# Patient Record
Sex: Female | Born: 2000 | Race: White | Hispanic: No | Marital: Single | Smoking: Former smoker
Health system: Southern US, Community
[De-identification: ages and names within clinical notes are randomized; demographics above are authoritative.]

## PROBLEM LIST (undated history)

## (undated) DIAGNOSIS — R011 Cardiac murmur, unspecified: Secondary | ICD-10-CM

## (undated) DIAGNOSIS — Z5189 Encounter for other specified aftercare: Secondary | ICD-10-CM

## (undated) DIAGNOSIS — N83209 Unspecified ovarian cyst, unspecified side: Secondary | ICD-10-CM

## (undated) HISTORY — DX: Encounter for other specified aftercare: Z51.89

## (undated) HISTORY — PX: CYST REMOVAL NECK: SHX6281

## (undated) HISTORY — DX: Unspecified ovarian cyst, unspecified side: N83.209

---
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
## 2018-04-13 ENCOUNTER — Encounter: Payer: Self-pay | Admitting: Obstetrics and Gynecology

## 2018-04-17 ENCOUNTER — Encounter: Payer: Managed Care, Other (non HMO) | Admitting: Obstetrics and Gynecology

## 2018-04-24 ENCOUNTER — Ambulatory Visit (INDEPENDENT_AMBULATORY_CARE_PROVIDER_SITE_OTHER): Payer: Managed Care, Other (non HMO) | Admitting: Obstetrics & Gynecology

## 2018-04-24 ENCOUNTER — Encounter: Payer: Self-pay | Admitting: Obstetrics & Gynecology

## 2018-04-24 ENCOUNTER — Other Ambulatory Visit: Payer: Self-pay | Admitting: Obstetrics & Gynecology

## 2018-04-24 VITALS — BP 90/60 | Ht 59.0 in | Wt 111.0 lb

## 2018-04-24 DIAGNOSIS — Z0000 Encounter for general adult medical examination without abnormal findings: Secondary | ICD-10-CM | POA: Diagnosis not present

## 2018-04-24 DIAGNOSIS — Z113 Encounter for screening for infections with a predominantly sexual mode of transmission: Secondary | ICD-10-CM

## 2018-04-24 NOTE — Progress Notes (Signed)
HPI:      [name] is a 17 y.o. G0P0000 who LMP was Patient's last menstrual period was 04/21/2018., she presents today for her annual examination. The patient has no complaints today. The patient is not sexually active. Her no prior history of gyn screening tests. The patient does not perform self breast exams.  There is no notable family history of breast or ovarian cancer in her family.  The patient has regular exercise: yes.  The patient denies current symptoms of depression.    GYN History: Contraception: abstinence  PMHx: History reviewed. No pertinent past medical history. History reviewed. No pertinent surgical history. Family History  Problem Relation Age of Onset  . Hypertension Mother    Social History   Tobacco Use  . Smoking status: Never Smoker  . Smokeless tobacco: Never Used  Substance Use Topics  . Alcohol use: No  . Drug use: Never   No current outpatient medications on file. Allergies: Patient has no known allergies.  Review of Systems  Constitutional: Negative for chills, fever and malaise/fatigue.  HENT: Negative for congestion, sinus pain and sore throat.   Eyes: Negative for blurred vision and pain.  Respiratory: Positive for cough. Negative for wheezing.   Cardiovascular: Negative for chest pain and leg swelling.  Gastrointestinal: Positive for diarrhea, nausea and vomiting. Negative for abdominal pain, constipation and heartburn.  Genitourinary: Negative for dysuria, frequency, hematuria and urgency.  Musculoskeletal: Positive for joint pain. Negative for back pain, myalgias and neck pain.  Skin: Negative for itching and rash.  Neurological: Negative for dizziness, tremors and weakness.  Endo/Heme/Allergies: Does not bruise/bleed easily.  Psychiatric/Behavioral: Negative for depression. The patient is not nervous/anxious and does not have insomnia.    Objective: BP 90/60   Ht  (1.499 m)   Wt 111 lb (50.3 kg)   LMP 04/21/2018    BMI 22.42 kg/m   Filed Weights   04/24/18 0816  Weight: 111 lb (50.3 kg)   Body mass index is 22.42 kg/m. Physical Exam  Constitutional: She is oriented to person, place, and time. She appears well-developed and well-nourished. No distress.  HENT:  Head: Normocephalic and atraumatic. Head is without laceration.  Right Ear: Hearing normal.  Left Ear: Hearing normal.  Nose: No epistaxis.  No foreign bodies.  Mouth/Throat: Uvula is midline, oropharynx is clear and moist and mucous membranes are normal.  Eyes: Pupils are equal, round, and reactive to light.  Neck: Normal range of motion. Neck supple. No thyromegaly present.  Cardiovascular: Normal rate and regular rhythm. Exam reveals no gallop and no friction rub.  No murmur heard. Pulmonary/Chest: Effort normal and breath sounds normal. No respiratory distress. She has no wheezes. Right breast exhibits no mass, no skin change and no tenderness. Left breast exhibits no mass, no skin change and no tenderness.  Abdominal: Soft. Bowel sounds are normal. She exhibits no distension. There is no tenderness. There is no rebound.  Musculoskeletal: Normal range of motion.  Neurological: She is alert and oriented to person, place, and time. No cranial nerve deficit.  Skin: Skin is warm and dry.  Psychiatric: She has a normal mood and affect. Judgment normal.  Vitals reviewed. GYN deferred  Assessment:  ANNUAL EXAM 1. Annual physical exam   2. Screen for STD (sexually transmitted disease)    Screening Plan:            1.  Cervical Screening-  Pap smear to be scheduled, age 26  2.  Counseling for contraception: no method desired; info provided as to options for birth control or for period control   3. Screen for STD (sexually transmitted disease) (routine) - GC probe amplification, urine    F/U  Return in about 1 year (around 04/25/2019) for Annual.  Annamarie Major, MD, [facility], [facility] 04/24/2018  8:46  AM

## 2018-04-24 NOTE — Patient Instructions (Signed)
Plan PAP age 17 Screen exam yearly Let [place] know if need birth control or other questions

## 2018-04-25 LAB — CHLAMYDIA/GONOCOCCUS/TRICHOMONAS, NAA
CHLAMYDIA BY NAA: NEGATIVE
Gonococcus by NAA: NEGATIVE
TRICH VAG BY NAA: NEGATIVE

## 2018-04-28 ENCOUNTER — Telehealth: Payer: Self-pay

## 2018-04-28 NOTE — Telephone Encounter (Signed)
Pt calling triage today requesting that [facility] send in OCP's. States they had talked about this at her appt this week, has not ever been on birth control so not sure what kind she would like. Please advise/RX and I can let pt know. She is aware you are off today.

## 2018-05-02 ENCOUNTER — Other Ambulatory Visit: Payer: Self-pay | Admitting: Obstetrics & Gynecology

## 2018-05-02 MED ORDER — NORETHIN ACE-ETH ESTRAD-FE 1-20 MG-MCG PO TABS: 1.0000 | ORAL_TABLET | Freq: Every day | ORAL | 11 refills | Status: DC

## 2018-05-02 NOTE — Progress Notes (Signed)
Testing neg for STD

## 2018-05-02 NOTE — Telephone Encounter (Signed)
ERx done for Junel OCP, to start after her next period (first Sunday after period starts)

## 2018-05-02 NOTE — Telephone Encounter (Signed)
Pt aware.

## 2018-05-09 ENCOUNTER — Encounter: Payer: Managed Care, Other (non HMO) | Admitting: Obstetrics and Gynecology

## 2018-05-12 ENCOUNTER — Emergency Department
Admission: EM | Admit: 2018-05-12 | Discharge: 2018-05-12 | Disposition: A | Discharge status: Home / Self Care | Payer: Managed Care, Other (non HMO) | Attending: Emergency Medicine | Admitting: Emergency Medicine

## 2018-05-12 ENCOUNTER — Emergency Department: Payer: Managed Care, Other (non HMO)

## 2018-05-12 ENCOUNTER — Other Ambulatory Visit: Payer: Self-pay

## 2018-05-12 DIAGNOSIS — N83201 Unspecified ovarian cyst, right side: Secondary | ICD-10-CM | POA: Insufficient documentation

## 2018-05-12 DIAGNOSIS — N939 Abnormal uterine and vaginal bleeding, unspecified: Secondary | ICD-10-CM | POA: Insufficient documentation

## 2018-05-12 LAB — URINALYSIS, COMPLETE (UACMP) WITH MICROSCOPIC
BILIRUBIN URINE: NEGATIVE
GLUCOSE, UA: NEGATIVE mg/dL
KETONES UR: 5 mg/dL — AB
LEUKOCYTES UA: NEGATIVE
NITRITE: NEGATIVE
PH: 5 (ref 5.0–8.0)
Protein, ur: NEGATIVE mg/dL
Specific Gravity, Urine: 1.016 (ref 1.005–1.030)

## 2018-05-12 LAB — BASIC METABOLIC PANEL
Anion gap: 6 (ref 5–15)
BUN: 20 mg/dL (ref 6–20)
CO2: 24 mmol/L (ref 22–32)
CREATININE: 0.68 mg/dL (ref 0.44–1.00)
Calcium: 9.1 mg/dL (ref 8.9–10.3)
Chloride: 108 mmol/L (ref 101–111)
GFR calc non Af Amer: >60 mL/min (ref >60)
Glucose, Bld: 138 mg/dL — ABNORMAL HIGH (ref 65–99)
POTASSIUM: 3.9 mmol/L (ref 3.5–5.1)
Sodium: 138 mmol/L (ref 135–145)

## 2018-05-12 LAB — TYPE AND SCREEN
ABO/RH(D): O POS
Antibody Screen: NEGATIVE

## 2018-05-12 LAB — WET PREP, GENITAL
CLUE CELLS WET PREP: NONE SEEN
Sperm: NONE SEEN
TRICH WET PREP: NONE SEEN
Yeast Wet Prep HPF POC: NONE SEEN

## 2018-05-12 LAB — CHLAMYDIA/NGC RT PCR (ARMC ONLY)
CHLAMYDIA TR: NOT DETECTED
N GONORRHOEAE: NOT DETECTED

## 2018-05-12 LAB — CBC
HCT: 35.8 % (ref 35.0–47.0)
Hemoglobin: 12 g/dL (ref 12.0–16.0)
MCH: 27.4 pg (ref 26.0–34.0)
MCHC: 33.5 g/dL (ref 32.0–36.0)
MCV: 81.7 fL (ref 80.0–100.0)
PLATELETS: 334 10*3/uL (ref 150–440)
RBC: 4.38 MIL/uL (ref 3.80–5.20)
RDW: 13.4 % (ref 11.5–14.5)
WBC: 20.2 10*3/uL — AB (ref 3.6–11.0)

## 2018-05-12 LAB — POC URINE PREG, ED: Preg Test, Ur: NEGATIVE

## 2018-05-12 MED ORDER — LEVONORGESTREL 1.5 MG PO TABS: 1.5000 mg | ORAL_TABLET | Freq: Once | ORAL | 0 refills | Status: AC

## 2018-05-12 NOTE — ED Provider Notes (Signed)
[facility] Emergency Department Provider Note  ____________________________________________  Time seen: Approximately 5:23 PM  I have reviewed the triage vital signs and the nursing notes.   HISTORY  Chief Complaint Vaginal Bleeding and Near Syncope    HPI Riot J [place] is a 17 y.o. female with no past medical history who complains of brisk vaginal bleeding after having intercourse for the first time today. She reports that bleeding began immediately upon penetration and so she and her partner stopped having sex. She also complains of pelvic pain worse on the right. pain is nonradiating, no fevers or chills no aggravating or alleviating factors. Pain is moderate intensity, achy.    past medical history negative   There are no active problems to display for this patient.    past surgical history negative   Prior to Admission medications   Medication Sig Start Date End Date Taking? Authorizing Provider  norethindrone-ethinyl estradiol (JUNEL FE,GILDESS FE,LOESTRIN FE) 1-20 MG-MCG tablet Take 1 tablet by mouth daily. 05/02/18 05/02/19  Nadara Mustard, MD     Allergies Patient has no known allergies.   Family History  Problem Relation Age of Onset  . Hypertension Mother     Social History Social History   Tobacco Use  . Smoking status: Never Smoker  . Smokeless tobacco: Never Used  Substance Use Topics  . Alcohol use: No  . Drug use: Never    Review of Systems  Constitutional:   No fever or chills.  ENT:   No sore throat. No rhinorrhea. Cardiovascular:   No chest pain or syncope. Respiratory:   No dyspnea or cough. Gastrointestinal:   positive for pelvic pain as above without vomiting and diarrhea.  Musculoskeletal:   Negative for focal pain or swelling All other systems reviewed and are negative except as documented above in ROS and HPI.  ____________________________________________   PHYSICAL EXAM:  VITAL SIGNS: ED Triage  Vitals  Enc Vitals Group     BP 05/12/18 1231 (!) 101/56     Pulse Rate 05/12/18 1231 87     Resp 05/12/18 1231 18     Temp 05/12/18 1231 (!) 97.5 F (36.4 C)     Temp Source 05/12/18 1231 Oral     SpO2 05/12/18 1231 99 %     Weight 05/12/18 1232 111 lb (50.3 kg)     Height 05/12/18 1232 4\' 11"  (1.499 m)     Head Circumference --      Peak Flow --      Pain Score 05/12/18 1232 4     Pain Loc --      Pain Edu? --      Excl. in GC? --     Vital signs reviewed, nursing assessments reviewed.   Constitutional:   Alert and oriented.nontoxic-appearing. Eyes:   Conjunctivae are normal. EOMI. PERRL. ENT      Head:   Normocephalic and atraumatic.      Nose:   No congestion/rhinnorhea.       Mouth/Throat:   MMM, no pharyngeal erythema. No peritonsillar mass.       Neck:   No meningismus. Full ROM. Hematological/Lymphatic/Immunilogical:   No cervical lymphadenopathy. Cardiovascular:   RRR. Symmetric bilateral radial and DP pulses.  No murmurs.  Respiratory:   Normal respiratory effort without tachypnea/retractions. Breath sounds are clear and equal bilaterally. No wheezes/rales/rhonchi. Gastrointestinal:   Soft suprapubic tenderness, worse slight to the right and no tenderness at McBurney's point. Non distended. There is no CVA tenderness.  No rebound, rigidity, or guarding. Genitourinary:   External exam unremarkable. Spec. Exam reveals copious clotted blood in the vault. No active bleeding after clearing clots with Fox swabs and ring forceps. Bimanual exam reveals right adnexal tenderness without palpable mass. No CMT. No purulence drainage. Musculoskeletal:   Normal range of motion in all extremities. No joint effusions.  No lower extremity tenderness.  No edema. Neurologic:   Normal speech and language.  Motor grossly intact. No acute focal neurologic deficits are appreciated.  Skin:    Skin is warm, dry and intact. No rash noted.  No petechiae, purpura, or  bullae.  ____________________________________________    LABS (pertinent positives/negatives) (all labs ordered are listed, but only abnormal results are displayed) Labs Reviewed  WET PREP, GENITAL - Abnormal; Notable for the following components:      Result Value   WBC, Wet Prep HPF POC RARE (*)    All other components within normal limits  BASIC METABOLIC PANEL - Abnormal; Notable for the following components:   Glucose, Bld 138 (*)    All other components within normal limits  CBC - Abnormal; Notable for the following components:   WBC 20.2 (*)    All other components within normal limits  URINALYSIS, COMPLETE (UACMP) WITH MICROSCOPIC - Abnormal; Notable for the following components:   Color, Urine YELLOW (*)    APPearance HAZY (*)    Hgb urine dipstick LARGE (*)    Ketones, ur 5 (*)    RBC / HPF >50 (*)    Bacteria, UA RARE (*)    All other components within normal limits  CHLAMYDIA/NGC RT PCR ([facility] ONLY)  POC URINE PREG, ED  TYPE AND SCREEN   ____________________________________________   EKG  interpreted by me Sinus tachycardia rate 101, normal axis intervals QRS ST segments. Isolated t wave inversion in lead III which is nonspecific.  ____________________________________________    RADIOLOGY  US Pelvis Transvanginal Non-ob (tv Only)  Result Date: 05/12/2018 CLINICAL DATA:  Pelvic and right lower quadrant abdomen pain with vaginal bleeding. EXAM: TRANSABDOMINAL AND TRANSVAGINAL ULTRASOUND OF PELVIS DOPPLER ULTRASOUND OF OVARIES TECHNIQUE: Both transabdominal and transvaginal ultrasound examinations of the pelvis were performed. Transabdominal technique was performed for global imaging of the pelvis including uterus, ovaries, adnexal regions, and pelvic cul-de-sac. It was necessary to proceed with endovaginal exam following the transabdominal exam to visualize the ovaries. Color was utilized to evaluate blood flow to the ovaries. COMPARISON:  None.  FINDINGS: Uterus Measurements: 7 x 3.1 x 4.5 cm. No fibroids or other mass visualized. Endometrium Thickness: 8.8 mm.  No focal abnormality visualized. Right ovary Measurements: 3.5 x 1.9 x 3.5 cm. There is a 2.5 x 1.8 x 2.1 cm cyst in the right ovary, possibly recently ruptured right ovarian cyst. Normal appearance/no adnexal mass. Left ovary Measurements: 3.5 x 2.8 x 3.1 cm. Normal appearance/no adnexal mass. Other findings Small amount of free fluid is identified in the pelvis. IMPRESSION: No acute abnormality. Probable recently ruptured right ovarian cyst. Electronically Signed   By: Sherian Rein M.D.   On: 05/12/2018 16:59   US Pelvis Complete  Result Date: 05/12/2018 CLINICAL DATA:  Pelvic and right lower quadrant abdomen pain with vaginal bleeding. EXAM: TRANSABDOMINAL AND TRANSVAGINAL ULTRASOUND OF PELVIS DOPPLER ULTRASOUND OF OVARIES TECHNIQUE: Both transabdominal and transvaginal ultrasound examinations of the pelvis were performed. Transabdominal technique was performed for global imaging of the pelvis including uterus, ovaries, adnexal regions, and pelvic cul-de-sac. It was necessary to proceed with endovaginal exam following  the transabdominal exam to visualize the ovaries. Color was utilized to evaluate blood flow to the ovaries. COMPARISON:  None. FINDINGS: Uterus Measurements: 7 x 3.1 x 4.5 cm. No fibroids or other mass visualized. Endometrium Thickness: 8.8 mm.  No focal abnormality visualized. Right ovary Measurements: 3.5 x 1.9 x 3.5 cm. There is a 2.5 x 1.8 x 2.1 cm cyst in the right ovary, possibly recently ruptured right ovarian cyst. Normal appearance/no adnexal mass. Left ovary Measurements: 3.5 x 2.8 x 3.1 cm. Normal appearance/no adnexal mass. Other findings Small amount of free fluid is identified in the pelvis. IMPRESSION: No acute abnormality. Probable recently ruptured right ovarian cyst. Electronically Signed   By: Sherian ReinWei-Chen  Lin M.D.   On: 05/12/2018 16:59     ____________________________________________   PROCEDURES Procedures  ____________________________________________    CLINICAL IMPRESSION / ASSESSMENT AND PLAN / ED COURSE  Pertinent labs & imaging results that were available during my care of the patient were reviewed by me and considered in my medical decision making (see chart for details).    Pt p/w pelvic pain after initial episode of intercourse assoc. With vaginal bleeding. No identifiable vaginal laceration.  Labs unremarkable. Bimanual exam does reveal right adnexal tenderness, will obtain US pelvis to eval for possible cyst. Doubt torsion, PID, or pregnancy. Used condoms, doubt STI.   Clinical Course as of May 12 1722  Fri May 12, 2018  1715 US pelvis shows 2cm right ovarian cyst, likely recently ruptured given sx. Doubt torsion. Not pregnant. Will refer to gyn f/u .    [PS]    Clinical Course User Index [PS] Sharman CheekStafford, Mariaelena Cade, MD     ____________________________________________   FINAL CLINICAL IMPRESSION(S) / ED DIAGNOSES    Final diagnoses:  Vaginal bleeding  Right ovarian cyst     ED Discharge Orders    None      Portions of this note were generated with dragon dictation software. Dictation errors may occur despite best attempts at proofreading.    Sharman CheekStafford, Marikay Roads, MD 05/12/18 1730

## 2018-05-12 NOTE — ED Triage Notes (Signed)
Pt presnets to ED via POV with c/o vaginal bleeding and near syncope. Pt states "I had sex for the first time this morning and like right after it was just a lot of pure water coming out with chunks". Pt reports while at work had vision changes, became pale, and felt like she was going to pass out. Pt presents to ED, not to be pale. Pt is alert and oriented.

## 2018-05-12 NOTE — ED Notes (Signed)
Patient in ultrasound at this time.

## 2018-05-12 NOTE — ED Notes (Signed)
Pelvic cart set up outside of room. 

## 2018-05-12 NOTE — Discharge Instructions (Addendum)
your lab tests today are normal. Your ultrasound shows a 2 cm right ovarian cyst. Please follow-up with gynecology for further evaluation.

## 2018-05-18 ENCOUNTER — Ambulatory Visit: Payer: Managed Care, Other (non HMO) | Admitting: Obstetrics & Gynecology

## 2018-05-19 ENCOUNTER — Encounter: Payer: Self-pay | Admitting: Obstetrics & Gynecology

## 2018-05-19 ENCOUNTER — Ambulatory Visit (INDEPENDENT_AMBULATORY_CARE_PROVIDER_SITE_OTHER): Payer: Managed Care, Other (non HMO) | Admitting: Obstetrics & Gynecology

## 2018-05-19 ENCOUNTER — Other Ambulatory Visit: Payer: Self-pay | Admitting: Obstetrics & Gynecology

## 2018-05-19 VITALS — BP 102/80 | Ht 59.0 in | Wt 112.0 lb

## 2018-05-19 DIAGNOSIS — N83201 Unspecified ovarian cyst, right side: Secondary | ICD-10-CM | POA: Diagnosis not present

## 2018-05-19 NOTE — Progress Notes (Signed)
  HPI: Patient is a 17 y.o. G0P0000 who LMP was Patient's last menstrual period was 04/21/2018., presents today for a problem visit.  She complains of recent findings of Right ovarion cyst by Ultrasound - Pelvic Vaginal.  Pt has had symptoms of pain, bleeding.  She also had bleeding that started w sex that night, and this was her first episode of sex (no lacerations found but may have been hymenal).  Previous evaluation: emergency room visit one week ago. Prior Diagnosis: None prior to this visit.  Seen and found to have a 2 cm ovarian cyst w features c/w rupture (collapsing, FF in pelvis).  Previous Treatment: none.  PMHx: She  has a past medical history of Ovarian cyst. Also,  has no past surgical history on file., family history includes Hypertension in her mother.,  reports that she has never smoked. She has never used smokeless tobacco. She reports that she does not drink alcohol or use drugs.  She has a current medication list which includes the following prescription(s): norethindrone-ethinyl estradiol. Also, has No Known Allergies.  Review of Systems  Constitutional: Negative for chills, fever and malaise/fatigue.  HENT: Negative for congestion, sinus pain and sore throat.   Eyes: Negative for blurred vision and pain.  Respiratory: Negative for cough and wheezing.   Cardiovascular: Negative for chest pain and leg swelling.  Gastrointestinal: Negative for abdominal pain, constipation, diarrhea, heartburn, nausea and vomiting.  Genitourinary: Negative for dysuria, frequency, hematuria and urgency.  Musculoskeletal: Negative for back pain, joint pain, myalgias and neck pain.  Skin: Negative for itching and rash.  Neurological: Negative for dizziness, tremors and weakness.  Endo/Heme/Allergies: Does not bruise/bleed easily.  Psychiatric/Behavioral: Negative for depression. The patient is not nervous/anxious and does not have insomnia.    Objective: BP 102/80   Ht 4\' 11"  (1.499 m)    Wt 112 lb (50.8 kg)   LMP 04/21/2018   BMI 22.62 kg/m  Physical Exam  Constitutional: She is oriented to person, place, and time. She appears well-developed and well-nourished. No distress.  Musculoskeletal: Normal range of motion.  Neurological: She is alert and oriented to person, place, and time.  Skin: Skin is warm and dry.  Psychiatric: She has a normal mood and affect.  Vitals reviewed.  ASSESSMENT/PLAN:    Cyst of right ovary    -  Primary  No further testing required today. Pain is resolving and she has completed normal period this week. Plans to take OCPs in future for birth control, but wishes to wait and not have sex after this experience. Pros and cons of hormones for cyst suppression if recurrent discussed.  Depo vs Pills discussed as well (as mother of patient desires her to take Depo).  Pt prefers pills.   A total of 15 minutes were spent face-to-face with the patient during this encounter and over half of that time dealt with counseling and coordination of care.  Annamarie MajorPaul Anis Degidio, MD, [facility] Frederick[facility]ACOG [facility], [facility] 05/19/2018  8:29 AM

## 2019-01-14 IMAGING — US US TRANSVAGINAL NON-OB
1 series · 13 of 25 positions shown · non-contrast
Comparison: None.

CLINICAL DATA: Pelvic and right lower quadrant abdomen pain with
vaginal bleeding.

EXAM:
TRANSABDOMINAL AND TRANSVAGINAL ULTRASOUND OF PELVIS
DOPPLER ULTRASOUND OF OVARIES
TECHNIQUE: Both transabdominal and transvaginal ultrasound examinations of the
pelvis were performed. Transabdominal technique was performed for
global imaging of the pelvis including uterus, ovaries, adnexal
regions, and pelvic cul-de-sac.
It was necessary to proceed with endovaginal exam following the
transabdominal exam to visualize the ovaries. Color was utilized to
evaluate blood flow to the ovaries.

[Series 1: us transvaginal non-ob · 13 of 101 slices shown]
[im 1/101]
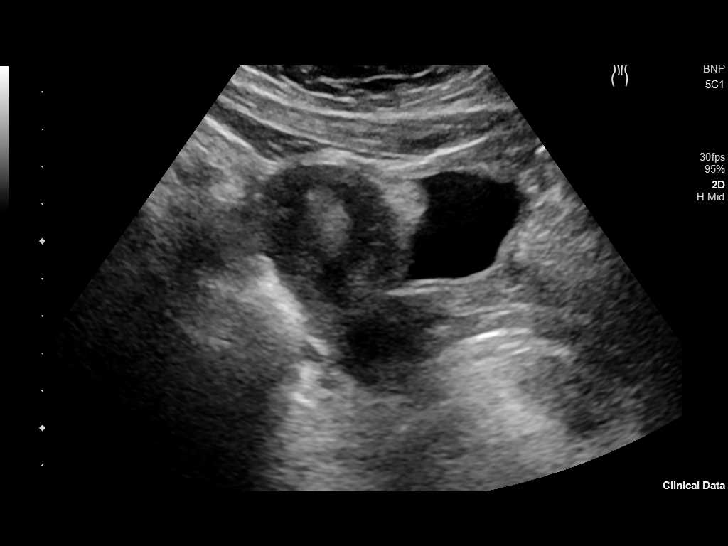
[im 9/101]
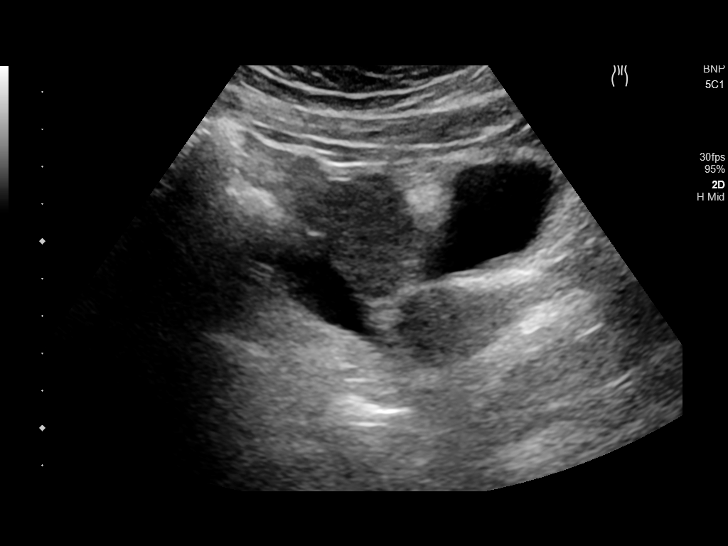
[im 17/101]
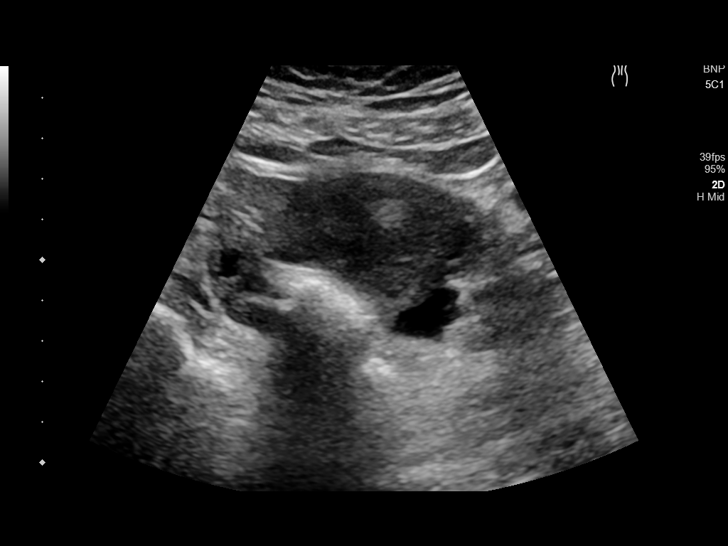
[im 26/101]
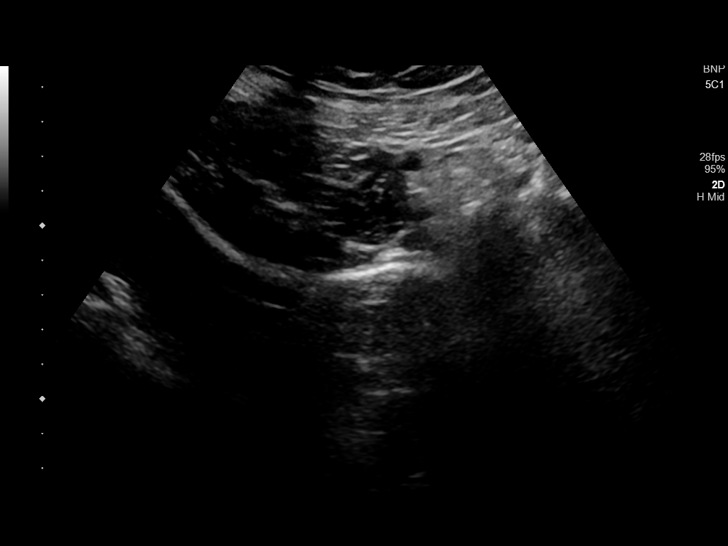
[im 34/101]
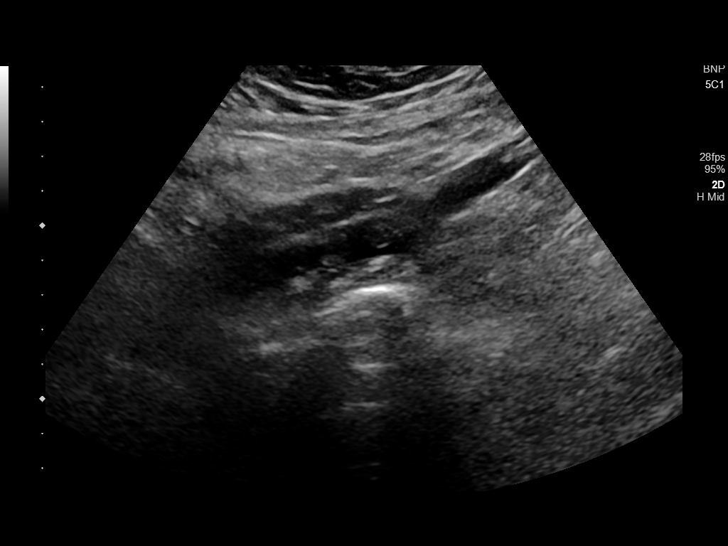
[im 42/101]
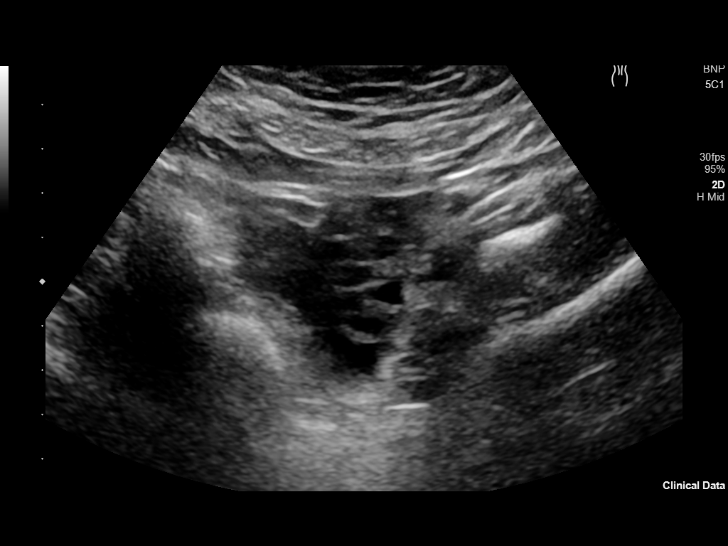
[im 51/101]
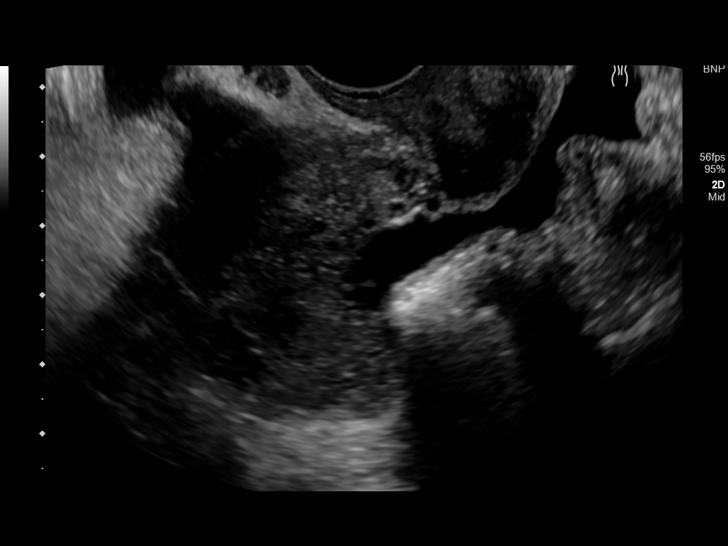
[im 59/101]
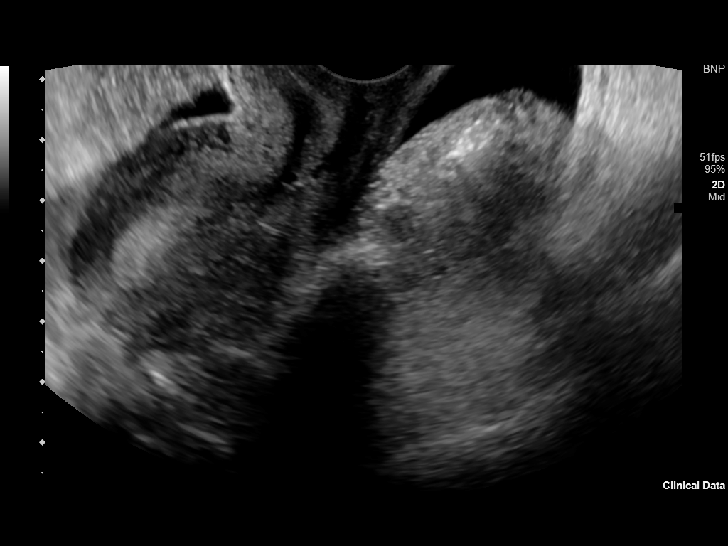
[im 67/101]
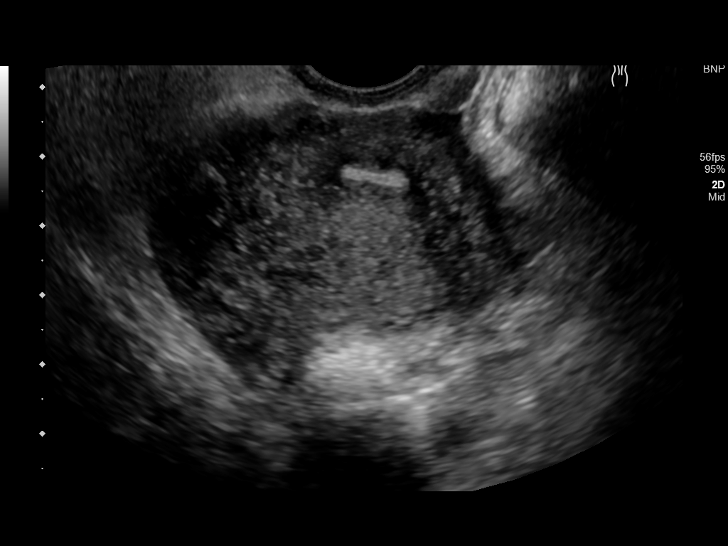
[im 76/101]
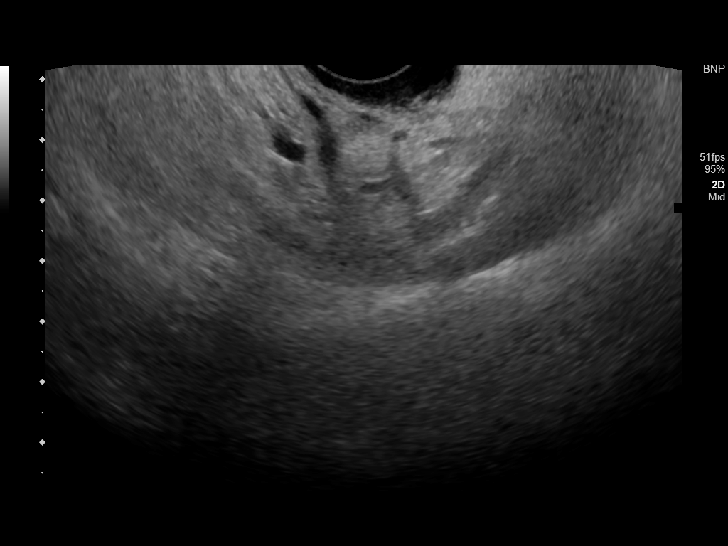
[im 84/101]
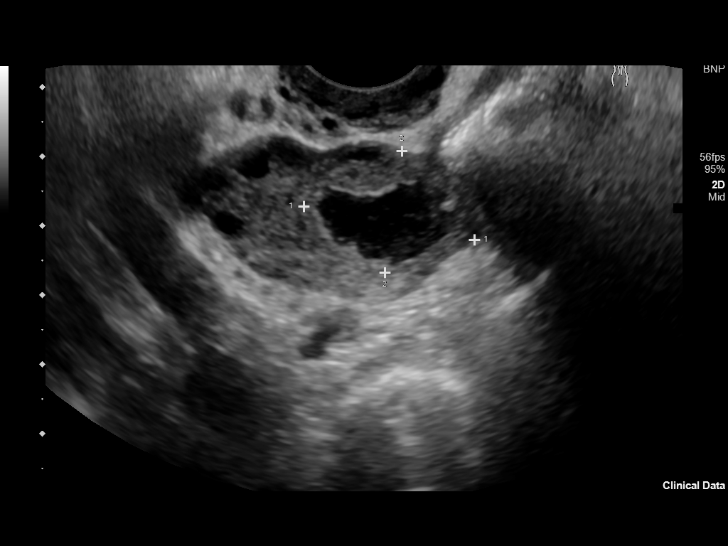
[im 92/101]
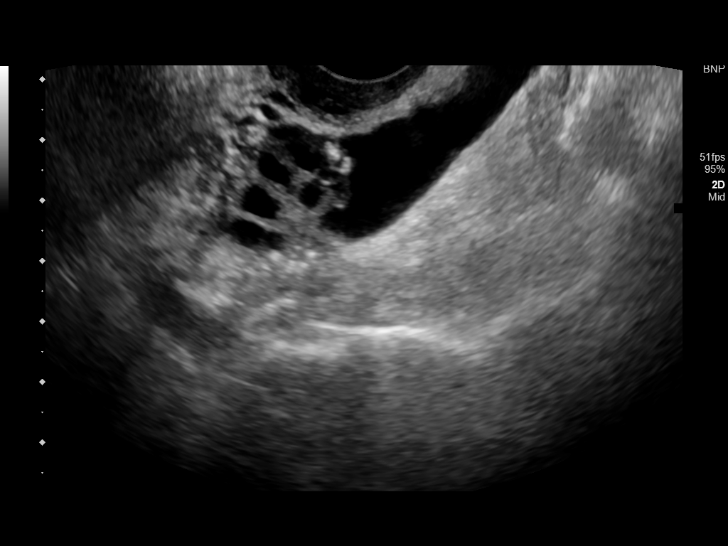
[im 101/101]
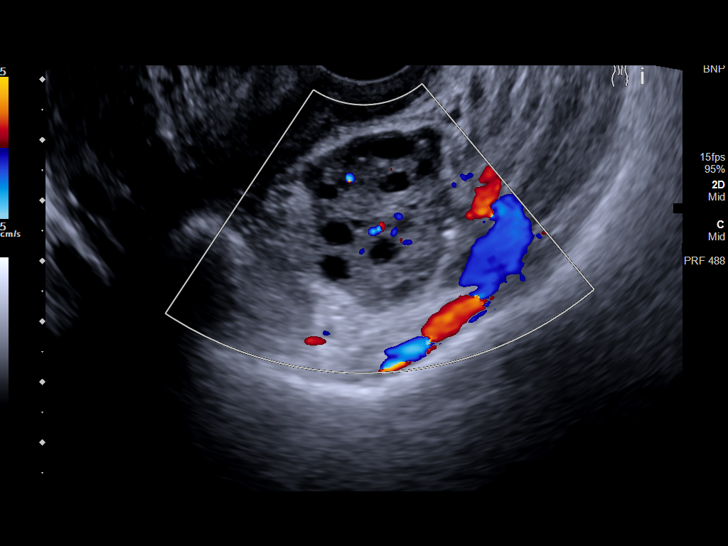

[13 of 25 positions shown; findings below may reference images not displayed]

FINDINGS: Uterus

Measurements: 7 x 3.1 x 4.5 cm. No fibroids or other mass
visualized.

Endometrium

Thickness: 8.8 mm.  No focal abnormality visualized.

Right ovary

Measurements: 3.5 x 1.9 x 3.5 cm. There is a 2.5 x 1.8 x 2.1 cm cyst
in the right ovary, possibly recently ruptured right ovarian cyst.
Normal appearance/no adnexal mass.

Left ovary

Measurements: 3.5 x 2.8 x 3.1 cm. Normal appearance/no adnexal mass.

Other findings

Small amount of free fluid is identified in the pelvis.
IMPRESSION: No acute abnormality. Probable recently ruptured right ovarian cyst.

## 2019-01-16 ENCOUNTER — Encounter: Payer: Self-pay | Admitting: Advanced Practice Midwife

## 2019-01-16 ENCOUNTER — Ambulatory Visit (INDEPENDENT_AMBULATORY_CARE_PROVIDER_SITE_OTHER): Payer: Managed Care, Other (non HMO) | Admitting: Advanced Practice Midwife

## 2019-01-16 ENCOUNTER — Encounter: Payer: Managed Care, Other (non HMO) | Admitting: Certified Nurse Midwife

## 2019-01-16 ENCOUNTER — Other Ambulatory Visit (HOSPITAL_COMMUNITY)
Admission: RE | Admit: 2019-01-16 | Discharge: 2019-01-16 | Disposition: A | Discharge status: Home / Self Care | Payer: Managed Care, Other (non HMO) | Source: Ambulatory Visit | Attending: Advanced Practice Midwife | Admitting: Advanced Practice Midwife

## 2019-01-16 VITALS — BP 118/76 | Wt 108.0 lb

## 2019-01-16 DIAGNOSIS — Z113 Encounter for screening for infections with a predominantly sexual mode of transmission: Secondary | ICD-10-CM | POA: Diagnosis present

## 2019-01-16 DIAGNOSIS — Z3400 Encounter for supervision of normal first pregnancy, unspecified trimester: Secondary | ICD-10-CM | POA: Insufficient documentation

## 2019-01-16 DIAGNOSIS — Z3401 Encounter for supervision of normal first pregnancy, first trimester: Secondary | ICD-10-CM

## 2019-01-16 DIAGNOSIS — Z3A12 12 weeks gestation of pregnancy: Secondary | ICD-10-CM

## 2019-01-16 NOTE — Patient Instructions (Signed)
Exercise During Pregnancy For people of all ages, exercise is an important part of being healthy. Exercise improves heart and lung function and helps to maintain strength, flexibility, and a healthy body weight. Exercise also boosts energy levels and elevates mood. For most women, maintaining an exercise routine throughout pregnancy is recommended. It is only on rare occasions and with certain medical conditions or pregnancy complications that women may be asked to limit or avoid exercise during pregnancy. What are some other benefits to exercising during pregnancy? Along with maintaining strength and flexibility, exercising throughout pregnancy can help to:  Keep strength in muscles that are very important during labor and childbirth.  Decrease low back pain during pregnancy.  Decrease the risk of developing gestational diabetes mellitus (GDM).  Improve blood sugar (glucose) control for women who have GDM.  Decrease the risk of developing preeclampsia. This is a serious condition that causes high blood pressure along with other symptoms, such as swelling and headaches.  Decrease the risk of cesarean delivery.  Speed up the recovery after giving birth. How often should I exercise? Unless your health care provider gives you different instructions, you should try to exercise on most days or all days of the week. In general, try to exercise with moderate intensity for about 150 minutes per week. This can be spread out across several days, such as exercising for 30 minutes per day on 5 days of each week. You can tell that you are exercising at a moderate intensity if you have a higher heart rate and faster breathing, but you are still able to hold a conversation. What types of moderate-intensity exercise are recommended during pregnancy? There are many types of exercise that are safe for you to do during pregnancy. Unless your health care provider gives you different instructions, do a variety of  exercises that safely increase your heart and breathing (cardiopulmonary) rates and help you to build and maintain muscle strength (strength training). You should always be able to talk in full sentences while exercising during pregnancy. Some examples of exercising that is safe to do during pregnancy include:  Brisk walking or hiking.  Swimming.  Water aerobics.  Riding a stationary bike.  Strength training.  Modified yoga or Pilates. Tell your instructor that you are pregnant. Avoid overstretching and avoid lying on your back for long periods of time.  Running or jogging. Only choose this type of exercise if: ? You ran or jogged regularly before your pregnancy. ? You can run or jog and still talk in complete sentences. What types of exercise should I not do during pregnancy? Depending on your level of fitness and whether you exercised regularly before your pregnancy, you may be advised to limit vigorous-intensity exercise during your pregnancy. You can tell that you are exercising at a vigorous intensity if you are breathing much harder and faster and cannot hold a conversation while exercising. Some examples of exercising that you should avoid during pregnancy include:  Contact sports.  Activities that place you at risk for falling on or being hit in the belly, such as downhill skiing, water skiing, surfing, rock climbing, cycling, gymnastics, and horseback riding.  Scuba diving.  Sky diving.  Yoga or Pilates in a room that is heated to extreme temperatures ("hot yoga" or "hot Pilates").  Jogging or running, unless you ran or jogged regularly before your pregnancy. While jogging or running, you should always be able to talk in full sentences. Do not run or jog so vigorously that you   are unable to have a conversation.  If you are not used to exercising at elevation (more than 6,000 feet above sea level), do not do so during your pregnancy. When should I avoid exercising during  pregnancy? Certain medical conditions can make it unsafe to exercise during pregnancy, or they may increase your risk of miscarriage or early labor and birth. Some of these conditions include:  Some types of heart disease.  Some types of lung disease.  Placenta previa. This is when the placenta partially or completely covers the opening of the uterus (cervix).  Frequent bleeding from the vagina during your pregnancy.  Incompetent cervix. This is when your cervix does not remain as tightly closed during pregnancy as it should.  Premature labor.  Ruptured membranes. This is when the protective sac (amniotic sac) opens up and amniotic fluid leaks from your vagina.  Severely low blood count (anemia).  Preeclampsia or pregnancy-caused high blood pressure.  Carrying more than one baby (multiple gestation) and having an additional risk of early labor.  Poorly controlled diabetes.  Being severely underweight or severely overweight.  Intrauterine growth restriction. This is when your baby's growth and development during pregnancy are slower than expected.  Other medical conditions. Ask your health care provider if any apply to you. What else should I know about exercising during pregnancy? You should take these precautions while exercising during pregnancy:  Avoid overheating. ? Wear loose-fitting, breathable clothes. ? Do not exercise in very high temperatures.  Avoid dehydration. Drink enough water before, during, and after exercise to keep your urine clear or pale yellow.  Avoid overstretching. Because of hormone changes during pregnancy, it is easy to overstretch muscles, tendons, and ligaments during pregnancy.  Start slowly and ask your health care provider to recommend types of exercise that are safe for you, if exercising regularly is new for you. Pregnancy is not a time for exercising to lose weight. When should I seek medical care? You should stop exercising and call your  health care provider if you have any unusual symptoms, such as:  Mild uterine contractions or abdominal cramping.  Dizziness that does not improve with rest. When should I seek immediate medical care? You should stop exercising and call your local emergency services (911 in the [place] if you have any unusual symptoms, such as:  Sudden, severe pain in your low back or your belly.  Uterine contractions or abdominal cramping that do not improve with rest.  Chest pain.  Bleeding or fluid leaking from your vagina.  Shortness of breath. This information is not intended to replace advice given to you by your health care provider. Make sure you discuss any questions you have with your health care provider. Document Released: 11/22/2005 Document Revised: 04/21/2016 Document Reviewed: 01/30/2015 Elsevier Interactive Patient Education  2019 [employer] Eating Plan for Pregnant Women While you are pregnant, your body requires additional nutrition to help support your growing baby. You also have a higher need for some vitamins and minerals, such as folic acid, calcium, iron, and vitamin D. Eating a healthy, well-balanced diet is very important for your health and your baby's health. Your need for extra calories varies for the three 3-month segments of your pregnancy (trimesters). For most women, it is recommended to consume:  150 extra calories a day during the first trimester.  300 extra calories a day during the second trimester.  300 extra calories a day during the third trimester. What are tips for following this plan?   Do   not try to lose weight or go on a diet during pregnancy.  Limit your overall intake of foods that have "empty calories." These are foods that have little nutritional value, such as sweets, desserts, candies, and sugar-sweetened beverages.  Eat a variety of foods (especially fruits and vegetables) to get a full range of vitamins and minerals.  Take a prenatal vitamin  to help meet your additional vitamin and mineral needs during pregnancy, specifically for folic acid, iron, calcium, and vitamin D.  Remember to stay active. Ask your health care provider what types of exercise and activities are safe for you.  Practice good food safety and cleanliness. Wash your hands before you eat and after you prepare raw meat. Wash all fruits and vegetables well before peeling or eating. Taking these actions can help to prevent food-borne illnesses that can be very dangerous to your baby, such as listeriosis. Ask your health care provider for more information about listeriosis. What does 150 extra calories look like? Healthy options that provide 150 extra calories each day could be any of the following:  6-8 oz (170-230 g) of plain low-fat yogurt with  cup of berries.  1 apple with 2 teaspoons (11 g) of peanut butter.  Cut-up vegetables with  cup (60 g) of hummus.  8 oz (230 mL) or 1 cup of low-fat chocolate milk.  1 stick of string cheese with 1 medium orange.  1 peanut butter and jelly sandwich that is made with one slice of whole-wheat bread and 1 tsp (5 g) of peanut butter. For 300 extra calories, you could eat two of those healthy options each day. What is a healthy amount of weight to gain? The right amount of weight gain for you is based on your BMI before you became pregnant. If your BMI:  Was less than 18 (underweight), you should gain 28-40 lb (13-18 kg).  Was 18-24.9 (normal), you should gain 25-35 lb (11-16 kg).  Was 25-29.9 (overweight), you should gain 15-25 lb (7-11 kg).  Was 30 or greater (obese), you should gain 11-20 lb (5-9 kg). What if I am having twins or multiples? Generally, if you are carrying twins or multiples:  You may need to eat 300-600 extra calories a day.  The recommended range for total weight gain is 25-54 lb (11-25 kg), depending on your BMI before pregnancy.  Talk with your health care provider to find out about  nutritional needs, weight gain, and exercise that is right for you. What foods can I eat?  Grains All grains. Choose whole grains, such as whole-wheat bread, oatmeal, or brown rice. Vegetables All vegetables. Eat a variety of colors and types of vegetables. Remember to wash your vegetables well before peeling or eating. Fruits All fruits. Eat a variety of colors and types of fruit. Remember to wash your fruits well before peeling or eating. Meats and other protein foods Lean meats, including chicken, turkey, fish, and lean cuts of beef, veal, or pork. If you eat fish or seafood, choose options that are higher in omega-3 fatty acids and lower in mercury, such as salmon, herring, mussels, trout, sardines, pollock, shrimp, crab, and lobster. Tofu. Tempeh. Beans. Eggs. Peanut butter and other nut butters. Make sure that all meats, poultry, and eggs are cooked to food-safe temperatures or "well-done." Two or more servings of fish are recommended each week in order to get the most benefits from omega-3 fatty acids that are found in seafood. Choose fish that are lower in mercury. You can   find more information online:  www.fda.gov Dairy Pasteurized milk and milk alternatives (such as almond milk). Pasteurized yogurt and pasteurized cheese. Cottage cheese. Sour cream. Beverages Water. Juices that contain 100% fruit juice or vegetable juice. Caffeine-free teas and decaffeinated coffee. Drinks that contain caffeine are okay to drink, but it is better to avoid caffeine. Keep your total caffeine intake to less than 200 mg each day (which is 12 oz or 355 mL of coffee, tea, or soda) or the limit as told by your health care provider. Fats and oils Fats and oils are okay to include in moderation. Sweets and desserts Sweets and desserts are okay to include in moderation. Seasoning and other foods All pasteurized condiments. The items listed above may not be a complete list of recommended foods and beverages.  Contact your dietitian for more options. What foods are not recommended? Vegetables Raw (unpasteurized) vegetable juices. Fruits Unpasteurized fruit juices. Meats and other protein foods Lunch meats, bologna, hot dogs, or other deli meats. (If you must eat those meats, reheat them until they are steaming hot.) Refrigerated pat, meat spreads from a meat counter, smoked seafood that is found in the refrigerated section of a store. Raw or undercooked meats, poultry, and eggs. Raw fish, such as sushi or sashimi. Fish that have high mercury content, such as tilefish, shark, swordfish, and king mackerel. To learn more about mercury in fish, talk with your health care provider or look for online resources, such as:  www.fda.gov Dairy Raw (unpasteurized) milk and any foods that have raw milk in them. Soft cheeses, such as feta, queso blanco, queso fresco, Brie, Camembert cheeses, blue-veined cheeses, and Panela cheese (unless it is made with pasteurized milk, which must be stated on the label). Beverages Alcohol. Sugar-sweetened beverages, such as sodas, teas, or energy drinks. Seasoning and other foods Homemade fermented foods and drinks, such as pickles, sauerkraut, or kombucha drinks. (Store-bought pasteurized versions of these are okay.) Salads that are made in a store or deli, such as ham salad, chicken salad, egg salad, tuna salad, and seafood salad. The items listed above may not be a complete list of foods and beverages to avoid. Contact your dietitian for more information. Where to find more information To calculate the number of calories you need based on your height, weight, and activity level, you can use an online calculator such as:  www.choosemyplate.gov/MyPlatePlan To calculate how much weight you should gain during pregnancy, you can use an online pregnancy weight gain calculator such as:  www.choosemyplate.gov/pregnancy-weight-gain-calculator Summary  While you are pregnant,  your body requires additional nutrition to help support your growing baby.  Eat a variety of foods, especially fruits and vegetables to get a full range of vitamins and minerals.  Practice good food safety and cleanliness. Wash your hands before you eat and after you prepare raw meat. Wash all fruits and vegetables well before peeling or eating. Taking these actions can help to prevent food-borne illnesses, such as listeriosis, that can be very dangerous to your baby.  Do not eat raw meat or fish. Do not eat fish that have high mercury content, such as tilefish, shark, swordfish, and king mackerel. Do not eat unpasteurized (raw) dairy.  Take a prenatal vitamin to help meet your additional vitamin and mineral needs during pregnancy, specifically for folic acid, iron, calcium, and vitamin D. This information is not intended to replace advice given to you by your health care provider. Make sure you discuss any questions you have with your health care   provider. Document Released: 09/06/2014 Document Revised: 08/19/2017 Document Reviewed: 08/19/2017 Elsevier Interactive Patient Education  2019 [employer] Prenatal Care Prenatal care is health care during pregnancy. It helps you and your unborn baby (fetus) stay as healthy as possible. Prenatal care may be provided by a midwife, a family practice health care provider, or a childbirth and pregnancy specialist (obstetrician). How does this affect me? During pregnancy, you will be closely monitored for any new conditions that might develop. To lower your risk of pregnancy complications, you and your health care provider will talk about any underlying conditions you have. How does this affect my baby? Early and consistent prenatal care increases the chance that your baby will be healthy during pregnancy. Prenatal care lowers the risk that your baby will be:  Born early (prematurely).  Smaller than expected at birth (small for gestational age). What  can I expect at the first prenatal care visit? Your first prenatal care visit will likely be the longest. You should schedule your first prenatal care visit as soon as you know that you are pregnant. Your first visit is a good time to talk about any questions or concerns you have about pregnancy. At your visit, you and your health care provider will talk about:  Your medical history, including: ? Any past pregnancies. ? Your family's medical history. ? The baby's father's medical history. ? Any long-term (chronic) health conditions you have and how you manage them. ? Any surgeries or procedures you have had. ? Any current over-the-counter or prescription medicines, herbs, or supplements you are taking.  Other factors that could pose a risk to your baby, including:  Your home setting and your stress levels, including: ? Exposure to abuse or violence. ? Household financial strain. ? Mental health conditions you have.  Your daily health habits, including diet and exercise. Your health care provider will also:  Measure your weight, height, and blood pressure.  Do a physical exam, including a pelvic and breast exam.  Perform blood tests and urine tests to check for: ? Urinary tract infection. ? Sexually transmitted infections (STIs). ? Low iron levels in your blood (anemia). ? Blood type and certain proteins on red blood cells (Rh antibodies). ? Infections and immunity to viruses, such as hepatitis B and rubella. ? HIV (human immunodeficiency virus).  Do an ultrasound to confirm your baby's growth and development and to help predict your estimated due date (EDD). This ultrasound is done with a probe that is inserted into the vagina (transvaginal ultrasound).  Discuss your options for genetic screening.  Give you information about how to keep yourself and your baby healthy, including: ? Nutrition and taking vitamins. ? Physical activity. ? How to manage pregnancy symptoms such as  nausea and vomiting (morning sickness). ? Infections and substances that may be harmful to your baby and how to avoid them. ? Food safety. ? Dental care. ? Working. ? Travel. ? Warning signs to watch for and when to call your health care provider. How often will I have prenatal care visits? After your first prenatal care visit, you will have regular visits throughout your pregnancy. The visit schedule is often as follows:  Up to week 28 of pregnancy: once every 4 weeks.  28-36 weeks: once every 2 weeks.  After 36 weeks: every week until delivery. Some women may have visits more or less often depending on any underlying health conditions and the health of the baby. Keep all follow-up and prenatal care visits as told by   your health care provider. This is important. What happens during routine prenatal care visits? Your health care provider will:  Measure your weight and blood pressure.  Check for fetal heart sounds.  Measure the height of your uterus in your abdomen (fundal height). This may be measured starting around week 20 of pregnancy.  Check the position of your baby inside your uterus.  Ask questions about your diet, sleeping patterns, and whether you can feel the baby move.  Review warning signs to watch for and signs of labor.  Ask about any pregnancy symptoms you are having and how you are dealing with them. Symptoms may include: ? Headaches. ? Nausea and vomiting. ? Vaginal discharge. ? Swelling. ? Fatigue. ? Constipation. ? Any discomfort, including back or pelvic pain. Make a list of questions to ask your health care provider at your routine visits. What tests might I have during prenatal care visits? You may have blood, urine, and imaging tests throughout your pregnancy, such as:  Urine tests to check for glucose, protein, or signs of infection.  Glucose tests to check for a form of diabetes that can develop during pregnancy (gestational diabetes mellitus).  This is usually done around week 24 of pregnancy.  An ultrasound to check your baby's growth and development and to check for birth defects. This is usually done around week 20 of pregnancy.  A test to check for group B strep (GBS) infection. This is usually done around week 36 of pregnancy.  Genetic testing. This may include blood or imaging tests, such as an ultrasound. Some genetic tests are done during the first trimester and some are done during the second trimester. What else can I expect during prenatal care visits? Your health care provider may recommend getting certain vaccines during pregnancy. These may include:  A yearly flu shot (annual influenza vaccine). This is especially important if you will be pregnant during flu season.  Tdap (tetanus, diphtheria, pertussis) vaccine. Getting this vaccine during pregnancy can protect your baby from whooping cough (pertussis) after birth. This vaccine may be recommended between weeks 27 and 36 of pregnancy. Later in your pregnancy, your health care provider may give you information about:  Childbirth and breastfeeding classes.  Choosing a health care provider for your baby.  Umbilical cord banking.  Breastfeeding.  Birth control after your baby is born.  The hospital labor and delivery unit and how to tour it.  Registering at the hospital before you go into labor. Where to find more information  Office on Women's Health: womenshealth.gov  American Pregnancy Association: americanpregnancy.org  March of Dimes: marchofdimes.org Summary  Prenatal care helps you and your baby stay as healthy as possible during pregnancy.  Your first prenatal care visit will most likely be the longest.  You will have visits and tests throughout your pregnancy to monitor your health and your baby's health.  Bring a list of questions to your visits to ask your health care provider.  Make sure to keep all follow-up and prenatal care visits with  your health care provider. This information is not intended to replace advice given to you by your health care provider. Make sure you discuss any questions you have with your health care provider. Document Released: 11/25/2003 Document Revised: 11/21/2017 Document Reviewed: 11/21/2017 Elsevier Interactive Patient Education  2019 [employer]  

## 2019-01-16 NOTE — Progress Notes (Signed)
NOB today. No vb. No lof ?

## 2019-01-16 NOTE — Progress Notes (Signed)
New Obstetric Patient H&P    Chief Complaint: "Desires prenatal care"   History of Present Illness: Patient is a 18 y.o. G1P0000 Not Hispanic or Latino female, presents with amenorrhea and positive home pregnancy test. Patient's last menstrual period was 10/22/2018 (approximate). and based on her  LMP, her EDD is Estimated Date of Delivery: 07/29/19 and her EGA is [redacted]w[redacted]d. Cycles are 4. days, regular, and occur approximately every : 32 days. She has never had a PAP smear due to her age.   She had a urine pregnancy test which was positive 2 or 3 week(s)  ago. Her last menstrual period was normal and lasted for  4 day(s). Since her LMP she claims she has experienced breast tenderness, fatigue, nausea, vomiting. She denies vaginal bleeding. Her past medical history is noncontributory. This is her first pregnancy.  Since her LMP, she admits to the use of tobacco products  No. She quit vaping before the pregnancy. She claims she has gained   6 pounds since the start of her pregnancy.  There are cats in the home in the home  no  She admits close contact with children on a regular basis  yes  She has had chicken pox in the past no She has had Tuberculosis exposures, symptoms, or previously tested positive for TB   no Current or past history of domestic violence. no  Genetic Screening/Teratology Counseling: (Includes patient, baby's father, or anyone in either family with:)   1. Patient's age >/= 9 at [facility]  no 2. Thalassemia ([place], [place], Mediterranean, or Asian background): MCV<80  no 3. Neural tube defect (meningomyelocele, spina bifida, anencephaly)  no 4. Congenital heart defect  no  5. Down syndrome  no 6. Tay-Sachs (Jewish, [place]  no 7. Canavan's Disease  no 8. Sickle cell disease or trait (African)  no  9. Hemophilia or other blood disorders  Patient's mother has von Willebrands (patient does not have vWb)  10. Muscular dystrophy  no  11. Cystic fibrosis  no  12.  Huntington's Chorea  no  13. Mental retardation/autism  no 14. Other inherited genetic or chromosomal disorder  no 15. Maternal metabolic disorder (DM, PKU, etc)  no 16. Patient or FOB with a child with a birth defect not listed above no  16a. Patient or FOB with a birth defect themselves no 17. Recurrent pregnancy loss, or stillbirth  no  18. Any medications since LMP other than prenatal vitamins (include vitamins, supplements, OTC meds, drugs, alcohol)  no 19. Any other genetic/environmental exposure to discuss  no  Infection History:   1. Lives with someone with TB or TB exposed  no  2. Patient or partner has history of genital herpes  no 3. Rash or viral illness since LMP  no 4. History of STI (GC, CT, HPV, syphilis, HIV)  no 5. History of recent travel :  no  Other pertinent information:  no     Review of Systems:10 point review of systems negative unless otherwise noted in HPI  Past Medical History:  Past Medical History:  Diagnosis Date  . Ovarian cyst     Past Surgical History:  History reviewed. No pertinent surgical history.  Gynecologic History: Patient's last menstrual period was 10/22/2018 (approximate).  Obstetric History: G1P0000  Family History:  Family History  Problem Relation Age of Onset  . Hypertension Mother     Social History:  Social History   Socioeconomic History  . Marital status: Single    Spouse name:  Not on file  . Number of children: Not on file  . Years of education: Not on file  . Highest education level: Not on file  Occupational History  . Not on file  Social Needs  . Financial resource strain: Not on file  . Food insecurity:    Worry: Not on file    Inability: Not on file  . Transportation needs:    Medical: Not on file    Non-medical: Not on file  Tobacco Use  . Smoking status: Former Smoker    Types: E-cigarettes  . Smokeless tobacco: Never Used  Substance and Sexual Activity  . Alcohol use: No  . Drug use:  Never  . Sexual activity: Yes    Birth control/protection: None  Lifestyle  . Physical activity:    Days per week: Not on file    Minutes per session: Not on file  . Stress: Not on file  Relationships  . Social connections:    Talks on phone: Not on file    Gets together: Not on file    Attends religious service: Not on file    Active member of club or organization: Not on file    Attends meetings of clubs or organizations: Not on file    Relationship status: Not on file  . Intimate partner violence:    Fear of current or ex partner: Not on file    Emotionally abused: Not on file    Physically abused: Not on file    Forced sexual activity: Not on file  Other Topics Concern  . Not on file  Social History Narrative  . Not on file    Allergies:  No Known Allergies  Medications: Prior to Admission medications   Not on File    Physical Exam Vitals: Blood pressure 118/76, weight 108 lb (49 kg), last menstrual period 10/22/2018.  General: NAD HEENT: normocephalic, anicteric Thyroid: no enlargement, no palpable nodules Pulmonary: No increased work of breathing, CTAB Cardiovascular: RRR, distal pulses 2+ Abdomen: NABS, soft, non-tender, non-distended.  Umbilicus without lesions.  No hepatomegaly, splenomegaly or masses palpable. No evidence of hernia  Genitourinary: deferred for no concerns/no PAP Extremities: no edema, erythema, or tenderness Neurologic: Grossly intact Psychiatric: mood appropriate, affect full   Assessment: 18 y.o. G1P0000 at [redacted]w[redacted]d presenting to initiate prenatal care  Plan: 1) Avoid alcoholic beverages. 2) Patient encouraged not to smoke.  3) Discontinue the use of all non-medicinal drugs and chemicals.  4) Take prenatal vitamins daily.  5) Nutrition, food safety (fish, cheese advisories, and high nitrite foods) and exercise discussed. 6) Hospital and practice style discussed with cross coverage system.  7) Genetic Screening, such as with 1st  Trimester Screening, cell free fetal DNA, AFP testing, and Ultrasound, as well as with amniocentesis and CVS as appropriate, is discussed with patient. At the conclusion of today's visit patient requested cell free DNA genetic testing 8) Patient is asked about travel to areas at risk for the Zika virus, and counseled to avoid travel and exposure to mosquitoes or sexual partners who may have themselves been exposed to the virus. Testing is discussed, and will be ordered as appropriate.  9) Urine culture and Urine aptima done today (patient has initiated pregnancy medicaid application) 10) Return in 1 week for dating and rob   Tresea Mall, CNM [facility], [facility] 01/16/2019, 4:47 PM

## 2019-01-16 NOTE — Progress Notes (Deleted)
New Obstetric Patient H&P    Chief Complaint: "Desires prenatal care"   History of Present Illness: Patient is a 18 y.o. G0P0000 Not Hispanic or Latino female, LMP *** presents with amenorrhea and positive home pregnancy test. Based on her  LMP, her EDD is Estimated Date of Delivery: None noted. and her EGA is Unknown. Cycles are {0-35:19561} {days/wks/mos/yrs:310907}, {Desc; regular/irreg:14544}, and occur approximately every : {numbers 22-35:14824} days. Her last pap smear was {numbers (fuzzy):14653} years ago and was {Findings; lab pap smear results:16707::"no abnormalities"}.    She had a urine pregnancy test which was positive {numbers (fuzzy):14653} {time frame:9076}  ago. Her last menstrual period was normal and lasted for  {numbers (fuzzy):14653} {time frame:9076}. Since her LMP she claims she has experienced ***. She denies vaginal bleeding. Her past medical history is {Noncontribuatory/Contributory:21644}. Her prior pregnancies are notable for {pregnancy complications:12320}  Since her LMP, she admits to the use of tobacco products  {yes/no:63} She claims she has gained   {inf wt change:14817} pounds since the start of her pregnancy.  There are cats in the home in the home  {yes/no:63} If yes {Desc; indoor/outdoor:13239} She admits close contact with children on a regular basis  {yes/no:63}  She has had chicken pox in the past {yes/no/unknown:74} She has had Tuberculosis exposures, symptoms, or previously tested positive for TB   {yes/no:63} Current or past history of domestic violence. {yes/no:63}  Genetic Screening/Teratology Counseling: (Includes patient, baby's father, or anyone in either family with:)   1. [facility] 5035 [facility]  {yes/no:63} 2. Thalassemia ([place], [place], Mediterranean, or Asian background): MCV<80  {yes/no:63} 3. Neural tube defect (meningomyelocele, spina bifida, anencephaly)  {yes/no:63} 4. Congenital heart defect  {yes/no:63}  5. Down  syndrome  {yes/no:63} 6. Tay-Sachs (Jewish, [place]French Canadian)  {yes/no:63} 7. Canavan's Disease  {yes/no:63} 8. Sickle cell disease or trait (African)  {yes/no:63}  9. Hemophilia or other blood disorders  {yes/no:63}  10. Muscular dystrophy  {yes/no:63}  11. Cystic fibrosis  {yes/no:63}  12. Huntington's Chorea  {yes/no:63}  13. Mental retardation/autism  {yes/no:63} 14. Other inherited genetic or chromosomal disorder  {yes/no:63} 15. Maternal metabolic disorder (DM, PKU, etc)  {yes/no:63} 16. Patient or FOB with a child with a birth defect not listed above no  16a. Patient or FOB with a birth defect themselves {yes/no:63} 17. Recurrent pregnancy loss, or stillbirth  {yes/no:63}  18. Any medications since LMP other than prenatal vitamins (include vitamins, supplements, OTC meds, drugs, alcohol)  {yes/no:63} 19. Any other genetic/environmental exposure to discuss  {yes/no:63}  Infection History:   1. Lives with someone with TB or TB exposed  {yes/no:63}  2. Patient or partner has history of genital herpes  {yes/no:63} 3. Rash or viral illness since LMP  {yes/no:63} 4. History of STI (GC, CT, HPV, syphilis, HIV)  {yes/no:63} 5. History of recent travel :  {yes/no:63}  Other pertinent information:  {yes/no:63}     Review of Systems:10 point review of systems negative unless otherwise noted in HPI  Past Medical History:  Past Medical History:  Diagnosis Date  . Ovarian cyst     Past Surgical History:  No past surgical history on file.  Gynecologic History: No LMP recorded.  Obstetric History: G0P0000  Family History:  Family History  Problem Relation Age of Onset  . Hypertension Mother     Social History:  Social History   Socioeconomic History  . Marital status: Single    Spouse name: Not on file  .  Number of children: Not on file  . Years of education: Not on file  . Highest education level: Not on file  Occupational History  . Not on file  Social Needs  .  Financial resource strain: Not on file  . Food insecurity:    Worry: Not on file    Inability: Not on file  . Transportation needs:    Medical: Not on file    Non-medical: Not on file  Tobacco Use  . Smoking status: Never Smoker  . Smokeless tobacco: Never Used  Substance and Sexual Activity  . Alcohol use: No  . Drug use: Never  . Sexual activity: Never    Birth control/protection: None  Lifestyle  . Physical activity:    Days per week: Not on file    Minutes per session: Not on file  . Stress: Not on file  Relationships  . Social connections:    Talks on phone: Not on file    Gets together: Not on file    Attends religious service: Not on file    Active member of club or organization: Not on file    Attends meetings of clubs or organizations: Not on file    Relationship status: Not on file  . Intimate partner violence:    Fear of current or ex partner: Not on file    Emotionally abused: Not on file    Physically abused: Not on file    Forced sexual activity: Not on file  Other Topics Concern  . Not on file  Social History Narrative  . Not on file    Allergies:  No Known Allergies  Medications: Prior to Admission medications   Medication Sig Start Date End Date Taking? Authorizing Provider  norethindrone-ethinyl estradiol (JUNEL FE,GILDESS FE,LOESTRIN FE) 1-20 MG-MCG tablet Take 1 tablet by mouth daily. Patient not taking: Reported on 05/19/2018 05/02/18 05/02/19  Nadara MustardHarris, Robert P, MD    Physical Exam Vitals: There were no vitals taken for this visit.  General: NAD HEENT: normocephalic, anicteric Thyroid: no enlargement, no palpable nodules Pulmonary: No increased work of breathing, CTAB Cardiovascular: RRR, distal pulses 2+ Abdomen: NABS, soft, non-tender, non-distended.  Umbilicus without lesions.  No hepatomegaly, splenomegaly or masses palpable. No evidence of hernia  Genitourinary:  External: Normal external female genitalia.  Normal urethral meatus,  normal  Bartholin's and Skene's glands.    Vagina: Normal vaginal mucosa, no evidence of prolapse.    Cervix: Grossly normal in appearance, no bleeding  Uterus: *** Non-enlarged, mobile, normal contour.  No CMT  Adnexa: ovaries non-enlarged, no adnexal masses  Rectal: deferred Extremities: no edema, erythema, or tenderness Neurologic: Grossly intact Psychiatric: mood appropriate, affect full   Assessment: 18 y.o. G0P0000 at Unknown presenting to initiate prenatal care  Plan: 1) Avoid alcoholic beverages. 2) Patient encouraged not to smoke.  3) Discontinue the use of all non-medicinal drugs and chemicals.  4) Take prenatal vitamins daily.  5) Nutrition, food safety (fish, cheese advisories, and high nitrite foods) and exercise discussed. 6) Hospital and practice style discussed with cross coverage system.  7) Genetic Screening, such as with 1st Trimester Screening, cell free fetal DNA, AFP testing, and Ultrasound, as well as with amniocentesis and CVS as appropriate, is discussed with patient. At the conclusion of today's visit patient {Desc; requested/declined/undecided:14580} genetic testing 8) Patient is asked about travel to areas at risk for the Zika virus, and counseled to avoid travel and exposure to mosquitoes or sexual partners who may have themselves been exposed  to the virus. Testing is discussed, and will be ordered as appropriate.

## 2019-01-18 LAB — URINE CULTURE

## 2019-01-18 LAB — CERVICOVAGINAL ANCILLARY ONLY
Chlamydia: POSITIVE — AB
NEISSERIA GONORRHEA: NEGATIVE
Trichomonas: NEGATIVE

## 2019-01-19 ENCOUNTER — Other Ambulatory Visit: Payer: Self-pay | Admitting: Advanced Practice Midwife

## 2019-01-19 DIAGNOSIS — A749 Chlamydial infection, unspecified: Secondary | ICD-10-CM

## 2019-01-19 DIAGNOSIS — O98812 Other maternal infectious and parasitic diseases complicating pregnancy, second trimester: Principal | ICD-10-CM

## 2019-01-19 MED ORDER — AZITHROMYCIN 500 MG PO TABS: 1000.0000 mg | ORAL_TABLET | Freq: Once | ORAL | 0 refills | Status: AC

## 2019-01-19 NOTE — Progress Notes (Signed)
Rx azithromycin sent to patient pharmacy. Voicemail left for patient.

## 2019-01-22 ENCOUNTER — Telehealth: Payer: Self-pay | Admitting: Advanced Practice Midwife

## 2019-01-22 NOTE — Telephone Encounter (Signed)
Patient is returning missed call please advise? 

## 2019-01-23 NOTE — Telephone Encounter (Signed)
Spoke with patient regarding lab result and treatment/partner should be tested and treated.

## 2019-01-24 ENCOUNTER — Ambulatory Visit (INDEPENDENT_AMBULATORY_CARE_PROVIDER_SITE_OTHER): Payer: Managed Care, Other (non HMO)

## 2019-01-24 ENCOUNTER — Ambulatory Visit (INDEPENDENT_AMBULATORY_CARE_PROVIDER_SITE_OTHER): Payer: Managed Care, Other (non HMO) | Admitting: Obstetrics & Gynecology

## 2019-01-24 VITALS — BP 100/60 | Wt 105.0 lb

## 2019-01-24 DIAGNOSIS — N8312 Corpus luteum cyst of left ovary: Secondary | ICD-10-CM

## 2019-01-24 DIAGNOSIS — O3481 Maternal care for other abnormalities of pelvic organs, first trimester: Secondary | ICD-10-CM | POA: Diagnosis not present

## 2019-01-24 DIAGNOSIS — Z3A12 12 weeks gestation of pregnancy: Secondary | ICD-10-CM

## 2019-01-24 DIAGNOSIS — Z832 Family history of diseases of the blood and blood-forming organs and certain disorders involving the immune mechanism: Secondary | ICD-10-CM

## 2019-01-24 DIAGNOSIS — Z3401 Encounter for supervision of normal first pregnancy, first trimester: Secondary | ICD-10-CM

## 2019-01-24 DIAGNOSIS — Z3400 Encounter for supervision of normal first pregnancy, unspecified trimester: Secondary | ICD-10-CM

## 2019-01-24 LAB — POCT URINALYSIS DIPSTICK OB
Glucose, UA: NEGATIVE
PROTEIN: NEGATIVE

## 2019-01-24 NOTE — Addendum Note (Signed)
Addended by: Cornelius Moras D on: 01/24/2019 11:57 AM   Modules accepted: Orders

## 2019-01-24 NOTE — Progress Notes (Signed)
  Subjective  No nausea, pain, bleeding  Objective  BP 100/60   Wt 105 lb (47.6 kg)   LMP 10/22/2018 (Approximate)   BMI 21.21 kg/m  General: NAD Pumonary: no increased work of breathing Abdomen: gravid, non-tender Extremities: no edema Psychiatric: mood appropriate, affect full  Assessment  18 y.o. G1P0000 at [redacted]w[redacted]d by  08/06/2019, by Ultrasound presenting for routine prenatal visit  Plan   Problem List Items Addressed This Visit      Other   Supervision of normal first pregnancy, antepartum - Primary   Relevant Orders   RPR+Rh+ABO+Rub Ab+Ab Scr+CB...   MaterniT21 PLUS Core+SCA    Other Visit Diagnoses    [redacted] weeks gestation of pregnancy       Relevant Orders   RPR+Rh+ABO+Rub Ab+Ab Scr+CB...   MaterniT21 PLUS Core+SCA   Family history of von Willebrand disease       Relevant Orders   Von Willebrand panel    Review of ULTRASOUND.    I have personally reviewed images and report of recent ultrasound done at [facility].    Plan of management to be discussed with patient. EDC changed due to cycle length and [place] results (EDC 08/06/2019, 12 2/7 weeks today)  TOC Chlamydia nv, counseled to use precautions and monitor for sx's  Genetic testing pros and cons today.  Other labs too.  Concerned about FH von Willebrands and that prior PCP told her she needed to be tested.  Will add tests today to see.  Annamarie Major, MD, [facility], [facility] 01/24/2019  11:53 AM

## 2019-01-24 NOTE — Patient Instructions (Signed)
DUE DATE 08/06/2019   Commonly Asked Questions During Pregnancy  Cats: A parasite can be excreted in cat feces.  To avoid exposure you need to have another person empty the little box.  If you must empty the litter box you will need to wear gloves.  Wash your hands after handling your cat.  This parasite can also be found in raw or undercooked meat so this should also be avoided.  Colds, Sore Throats, Flu: Please check your medication sheet to see what you can take for symptoms.  If your symptoms are unrelieved by these medications please call the office.  Dental Work: Most any dental work Agricultural consultant recommends is permitted.  X-rays should only be taken during the first trimester if absolutely necessary.  Your abdomen should be shielded with a lead apron during all x-rays.  Please notify your provider prior to receiving any x-rays.  Novocaine is fine; gas is not recommended.  If your dentist requires a note from [place] prior to dental work please call the office and we will provide one for you.  Exercise: Exercise is an important part of staying healthy during your pregnancy.  You may continue most exercises you were accustomed to prior to pregnancy.  Later in your pregnancy you will most likely notice you have difficulty with activities requiring balance like riding a bicycle.  It is important that you listen to your body and avoid activities that put you at a higher risk of falling.  Adequate rest and staying well hydrated are a must!  If you have questions about the safety of specific activities ask your provider.    Exposure to Children with illness: Try to avoid obvious exposure; report any symptoms to [place] when noted,  If you have chicken pos, red measles or mumps, you should be immune to these diseases.   Please do not take any vaccines while pregnant unless you have checked with your OB provider.  Fetal Movement: After 28 weeks we recommend you do "kick counts" twice daily.  Lie or sit down in a  calm quiet environment and count your baby movements "kicks".  You should feel your baby at least 10 times per hour.  If you have not felt 10 kicks within the first hour get up, walk around and have something sweet to eat or drink then repeat for an additional hour.  If count remains less than 10 per hour notify your provider.  Fumigating: Follow your pest control agent's advice as to how long to stay out of your home.  Ventilate the area well before re-entering.  Hemorrhoids:   Most over-the-counter preparations can be used during pregnancy.  Check your medication to see what is safe to use.  It is important to use a stool softener or fiber in your diet and to drink lots of liquids.  If hemorrhoids seem to be getting worse please call the office.   Hot Tubs:  Hot tubs Jacuzzis and saunas are not recommended while pregnant.  These increase your internal body temperature and should be avoided.  Intercourse:  Sexual intercourse is safe during pregnancy as long as you are comfortable, unless otherwise advised by your provider.  Spotting may occur after intercourse; report any bright red bleeding that is heavier than spotting.  Labor:  If you know that you are in labor, please go to the hospital.  If you are unsure, please call the office and let us help you decide what to do.  Lifting, straining, etc:  If your job requires heavy lifting or straining please check with your provider for any limitations.  Generally, you should not lift items heavier than that you can lift simply with your hands and arms (no back muscles)  Painting:  Paint fumes do not harm your pregnancy, but may make you ill and should be avoided if possible.  Latex or water based paints have less odor than oils.  Use adequate ventilation while painting.  Permanents & Hair Color:  Chemicals in hair dyes are not recommended as they cause increase hair dryness which can increase hair loss during pregnancy.  " Highlighting" and permanents  are allowed.  Dye may be absorbed differently and permanents may not hold as well during pregnancy.  Sunbathing:  Use a sunscreen, as skin burns easily during pregnancy.  Drink plenty of fluids; avoid over heating.  Tanning Beds:  Because their possible side effects are still unknown, tanning beds are not recommended.  Ultrasound Scans:  Routine ultrasounds are performed at approximately 20 weeks.  You will be able to see your baby's general anatomy an if you would like to know the gender this can usually be determined as well.  If it is questionable when you conceived you may also receive an ultrasound early in your pregnancy for dating purposes.  Otherwise ultrasound exams are not routinely performed unless there is a medical necessity.  Although you can request a scan we ask that you pay for it when conducted because insurance does not cover " patient request" scans.  Work: If your pregnancy proceeds without complications you may work until your due date, unless your physician or employer advises otherwise.  Round Ligament Pain/Pelvic Discomfort:  Sharp, shooting pains not associated with bleeding are fairly common, usually occurring in the second trimester of pregnancy.  They tend to be worse when standing up or when you remain standing for long periods of time.  These are the result of pressure of certain pelvic ligaments called "round ligaments".  Rest, Tylenol and heat seem to be the most effective relief.  As the womb and fetus grow, they rise out of the pelvis and the discomfort improves.  Please notify the office if your pain seems different than that described.  It may represent a more serious condition.

## 2019-01-25 LAB — RPR+RH+ABO+RUB AB+AB SCR+CB...
Antibody Screen: NEGATIVE
HIV SCREEN 4TH GENERATION: NONREACTIVE
Hematocrit: 35.5 % (ref 34.0–46.6)
Hemoglobin: 12 g/dL (ref 11.1–15.9)
Hepatitis B Surface Ag: NEGATIVE
MCH: 25.9 pg — ABNORMAL LOW (ref 26.6–33.0)
MCHC: 33.8 g/dL (ref 31.5–35.7)
MCV: 77 fL — ABNORMAL LOW (ref 79–97)
Platelets: 261 10*3/uL (ref 150–450)
RBC: 4.64 x10E6/uL (ref 3.77–5.28)
RDW: 14.4 % (ref 11.7–15.4)
RPR: NONREACTIVE
RUBELLA: 9.26 {index} (ref >0.99)
Rh Factor: POSITIVE
Varicella zoster IgG: 1818 index (ref >165)
WBC: 6.7 10*3/uL (ref 3.4–10.8)

## 2019-01-26 ENCOUNTER — Encounter: Payer: Self-pay | Admitting: Advanced Practice Midwife

## 2019-01-26 ENCOUNTER — Ambulatory Visit (INDEPENDENT_AMBULATORY_CARE_PROVIDER_SITE_OTHER): Payer: Managed Care, Other (non HMO) | Admitting: Advanced Practice Midwife

## 2019-01-26 VITALS — BP 110/74 | Wt 108.0 lb

## 2019-01-26 DIAGNOSIS — Z3A12 12 weeks gestation of pregnancy: Secondary | ICD-10-CM

## 2019-01-26 DIAGNOSIS — R102 Pelvic and perineal pain: Secondary | ICD-10-CM

## 2019-01-26 DIAGNOSIS — O26891 Other specified pregnancy related conditions, first trimester: Secondary | ICD-10-CM

## 2019-01-26 DIAGNOSIS — O26899 Other specified pregnancy related conditions, unspecified trimester: Secondary | ICD-10-CM

## 2019-01-26 DIAGNOSIS — Z3400 Encounter for supervision of normal first pregnancy, unspecified trimester: Secondary | ICD-10-CM

## 2019-01-26 LAB — POCT URINALYSIS DIPSTICK
Bilirubin, UA: NEGATIVE
Blood, UA: NEGATIVE
Glucose, UA: NEGATIVE
Ketones, UA: NEGATIVE
NITRITE UA: NEGATIVE
Protein, UA: POSITIVE — AB
Spec Grav, UA: 1.015 (ref 1.010–1.025)
Urobilinogen, UA: NEGATIVE E.U./dL — AB
pH, UA: 8.5 — AB (ref 5.0–8.0)

## 2019-01-26 LAB — COAG STUDIES INTERP REPORT

## 2019-01-26 LAB — VON WILLEBRAND PANEL
Factor VIII Activity: 157 % — ABNORMAL HIGH (ref 56–140)
VON WILLEBRAND FACTOR: 123 % (ref 50–200)
Von Willebrand Ag: 140 % (ref 50–200)

## 2019-01-26 NOTE — Progress Notes (Signed)
  Routine Prenatal Care Visit  Subjective  Lisa Solis Lisa Solis is a 18 y.o. G1P0000 at [redacted]w[redacted]d being seen today for ongoing prenatal care.  She is currently monitored for the following issues for this low-risk pregnancy and has Supervision of normal first pregnancy, antepartum on their problem list.  ----------------------------------------------------------------------------------- Patient reports sharp pains in her lower abdomen that started around 8:00 this morning. She feels them every 15 to 20 minutes and they last about 5 minutes each time. She admits drinking 2 water bottles per day. Discussed the importance of hydration. Reviewed comfort measures including warm bath, heating pad, tylenol. Repeat urine culture today.    Contractions: Not present. Vag. Bleeding: None.   . Denies leaking of fluid.  ----------------------------------------------------------------------------------- The following portions of the patient's history were reviewed and updated as appropriate: allergies, current medications, past family history, past medical history, past social history, past surgical history and problem list. Problem list updated.   Objective  Blood pressure 110/74, weight 108 lb (49 kg), last menstrual period 10/22/2018. Pregravid weight 108 lb (49 kg) Total Weight Gain 0 lb (0 kg)   Urinalysis: Urine Protein    Urine Glucose    Results for Lisa Solis, Lisa Solis (MRN [id number]) as of 01/26/2019 11:04  Ref. Range 01/26/2019 11:01  Bilirubin, UA Unknown neg  Clarity, UA Unknown cloudyn  Color, UA Unknown yellow  Glucose Latest Ref Range: Negative  Negative  Ketones, UA Unknown neg  Leukocytes, UA Latest Ref Range: Negative  Small (1+) (A)  Nitrite, UA Unknown neg  pH, UA Latest Ref Range: 5.0 - 8.0  8.5 (A)  Protein, UA Latest Ref Range: Negative  Positive (A)  Specific Gravity, UA Latest Ref Range: 1.010 - 1.025  1.015  Urobilinogen, UA Latest Ref Range: 0.2 or 1.0 E.U./dL negative (A)  RBC, UA Unknown  neg    Fetal Status: Fetal Heart Rate (bpm): 150         General:  Alert, oriented and cooperative. Patient is in no acute distress.  Skin: Skin is warm and dry. No rash noted.   Cardiovascular: Normal heart rate noted  Respiratory: Normal respiratory effort, no problems with respiration noted  Abdomen: Soft, gravid, appropriate for gestational age. Pain/Pressure: Present     Pelvic:  Cervical exam deferred        Extremities: Normal range of motion.     Mental Status: Normal mood and affect. Normal behavior. Normal judgment and thought content.   Assessment   18 y.o. G1P0000 at [redacted]w[redacted]d by  08/06/2019, by Ultrasound presenting for work-in prenatal visit  Plan   pregnancy Problems (from 01/16/19 to present)    No problems associated with this episode.       Preterm labor symptoms and general obstetric precautions including but not limited to vaginal bleeding, contractions, leaking of fluid and fetal movement were reviewed in detail with the patient.  Urine Culture sent today  Return for has follow up scheduled.  Tresea Mall, CNM 01/26/2019 11:18 AM

## 2019-01-26 NOTE — Progress Notes (Signed)
Sharp pelvic pains. No vb. No lof.

## 2019-01-28 LAB — URINE CULTURE

## 2019-01-29 LAB — MATERNIT21 PLUS CORE+SCA
CHROMOSOME 21: NEGATIVE
Chromosome 13: NEGATIVE
Chromosome 18: NEGATIVE
Fetal Fraction: 6
Y Chromosome: DETECTED

## 2019-02-21 ENCOUNTER — Other Ambulatory Visit (HOSPITAL_COMMUNITY)
Admission: RE | Admit: 2019-02-21 | Discharge: 2019-02-21 | Disposition: A | Discharge status: Home / Self Care | Payer: Managed Care, Other (non HMO) | Source: Ambulatory Visit | Attending: Maternal Newborn | Admitting: Maternal Newborn

## 2019-02-21 ENCOUNTER — Other Ambulatory Visit: Payer: Self-pay

## 2019-02-21 ENCOUNTER — Encounter: Payer: Self-pay | Admitting: Maternal Newborn

## 2019-02-21 ENCOUNTER — Ambulatory Visit (INDEPENDENT_AMBULATORY_CARE_PROVIDER_SITE_OTHER): Payer: Managed Care, Other (non HMO) | Admitting: Maternal Newborn

## 2019-02-21 VITALS — BP 108/70 | Wt 106.0 lb

## 2019-02-21 DIAGNOSIS — Z3689 Encounter for other specified antenatal screening: Secondary | ICD-10-CM

## 2019-02-21 DIAGNOSIS — Z3402 Encounter for supervision of normal first pregnancy, second trimester: Secondary | ICD-10-CM

## 2019-02-21 DIAGNOSIS — Z3400 Encounter for supervision of normal first pregnancy, unspecified trimester: Secondary | ICD-10-CM | POA: Diagnosis not present

## 2019-02-21 DIAGNOSIS — Z3A16 16 weeks gestation of pregnancy: Secondary | ICD-10-CM

## 2019-02-21 DIAGNOSIS — Z113 Encounter for screening for infections with a predominantly sexual mode of transmission: Secondary | ICD-10-CM | POA: Diagnosis present

## 2019-02-21 LAB — POCT URINALYSIS DIPSTICK OB
Glucose, UA: NEGATIVE
POC,PROTEIN,UA: NEGATIVE

## 2019-02-21 NOTE — Patient Instructions (Signed)

## 2019-02-21 NOTE — Progress Notes (Signed)
    Routine Prenatal Care Visit  Subjective  Lisa Solis is a 18 y.o. G1P0000 at [redacted]w[redacted]d being seen today for ongoing prenatal care.  She is currently monitored for the following issues for this low-risk pregnancy and has Supervision of normal first pregnancy, antepartum on their problem list.  ----------------------------------------------------------------------------------- Patient reports no complaints.   Contractions: Not present. Vag. Bleeding: None.   . No leaking of fluid.  ----------------------------------------------------------------------------------- The following portions of the patient's history were reviewed and updated as appropriate: allergies, current medications, past family history, past medical history, past social history, past surgical history and problem list. Problem list updated.  Objective  Blood pressure 108/70, weight 106 lb (48.1 kg), last menstrual period 10/22/2018. Pregravid weight 108 lb (49 kg) Total Weight Gain -2 lb (-0.907 kg) Body mass index is 21.41 kg/m.   Urinalysis: Urine dipstick shows negative for glucose, protein.  Fetal Status: Fetal Heart Rate (bpm): 152         General:  Alert, oriented and cooperative. Patient is in no acute distress.  Skin: Skin is warm and dry. No rash noted.   Cardiovascular: Normal heart rate noted  Respiratory: Normal respiratory effort, no problems with respiration noted  Abdomen: Soft, gravid, appropriate for gestational age. Pain/Pressure: Absent     Pelvic:  Cervical exam deferred        Extremities: Normal range of motion.     Mental Status: Normal mood and affect. Normal behavior. Normal judgment and thought content.     Assessment   18 y.o. G1P0000 at [redacted]w[redacted]d, EDD 08/06/2019 by Ultrasound presenting for a routine prenatal visit.  Plan   pregnancy Problems (from 01/16/19 to present)    Problem Noted Resolved   Supervision of normal first pregnancy, antepartum 01/16/2019 by Tresea Mall, CNM No   Overview Addendum 02/21/2019  8:17 AM by Oswaldo Conroy, CNM    Clinic [facility] Prenatal Labs  Dating Ultrasound 12w 2d Blood type: O/Positive/-- (02/19 1158)   Genetic Screen NIPS: Negative XY Antibody:Negative (02/19 1158)  Anatomic [place]  Rubella: 9.26 (02/19 1158) Varicella: Immune  GTT Early:               Third trimester:  RPR: Non Reactive (02/19 1158)   Rhogam  HBsAg: Negative (02/19 1158)   TDaP vaccine   Flu Shot: Nov 2019 HIV: Non Reactive (02/19 1158)   Baby Food                                GBS:   Contraception  Pap: N/A < 21  CBB     CS/VBAC NA   Support Person Margot Ables of cure today.  Please refer to After Visit Summary for other counseling recommendations.   Return in about 4 weeks (around 03/21/2019) for ROB and anatomy scan.  Marcelyn Bruins, CNM 02/21/2019

## 2019-02-21 NOTE — Progress Notes (Signed)
No vb. No lof.  

## 2019-02-22 LAB — CERVICOVAGINAL ANCILLARY ONLY
Chlamydia: POSITIVE — AB
Neisseria Gonorrhea: NEGATIVE

## 2019-02-23 ENCOUNTER — Other Ambulatory Visit: Payer: Self-pay | Admitting: Maternal Newborn

## 2019-02-23 DIAGNOSIS — A749 Chlamydial infection, unspecified: Secondary | ICD-10-CM

## 2019-02-23 DIAGNOSIS — O98819 Other maternal infectious and parasitic diseases complicating pregnancy, unspecified trimester: Principal | ICD-10-CM

## 2019-02-23 MED ORDER — AZITHROMYCIN 500 MG PO TABS: 1000.0000 mg | ORAL_TABLET | Freq: Once | ORAL | 0 refills | Status: AC

## 2019-02-23 NOTE — Progress Notes (Signed)
Rx for azithromycin, TOC Positive for chlamydia. Reinforced that partner also needs treatment to ensure effectiveness.

## 2019-03-22 ENCOUNTER — Encounter: Payer: Managed Care, Other (non HMO) | Admitting: Maternal Newborn

## 2019-03-22 ENCOUNTER — Ambulatory Visit: Payer: Managed Care, Other (non HMO)

## 2019-03-22 NOTE — Telephone Encounter (Signed)
This encounter was created in error - please disregard.

## 2019-03-30 ENCOUNTER — Encounter: Payer: Self-pay | Admitting: Obstetrics and Gynecology

## 2019-03-30 ENCOUNTER — Ambulatory Visit (INDEPENDENT_AMBULATORY_CARE_PROVIDER_SITE_OTHER): Payer: Managed Care, Other (non HMO)

## 2019-03-30 ENCOUNTER — Ambulatory Visit (INDEPENDENT_AMBULATORY_CARE_PROVIDER_SITE_OTHER): Payer: Managed Care, Other (non HMO) | Admitting: Obstetrics and Gynecology

## 2019-03-30 ENCOUNTER — Other Ambulatory Visit (HOSPITAL_COMMUNITY)
Admission: RE | Admit: 2019-03-30 | Discharge: 2019-03-30 | Disposition: A | Discharge status: Home / Self Care | Payer: Managed Care, Other (non HMO) | Source: Ambulatory Visit | Attending: Obstetrics and Gynecology | Admitting: Obstetrics and Gynecology

## 2019-03-30 ENCOUNTER — Other Ambulatory Visit: Payer: Self-pay

## 2019-03-30 VITALS — BP 110/68 | Wt 109.0 lb

## 2019-03-30 DIAGNOSIS — O98819 Other maternal infectious and parasitic diseases complicating pregnancy, unspecified trimester: Secondary | ICD-10-CM

## 2019-03-30 DIAGNOSIS — Z3A21 21 weeks gestation of pregnancy: Secondary | ICD-10-CM

## 2019-03-30 DIAGNOSIS — A749 Chlamydial infection, unspecified: Secondary | ICD-10-CM | POA: Diagnosis present

## 2019-03-30 DIAGNOSIS — Z3400 Encounter for supervision of normal first pregnancy, unspecified trimester: Secondary | ICD-10-CM | POA: Diagnosis present

## 2019-03-30 DIAGNOSIS — Z363 Encounter for antenatal screening for malformations: Secondary | ICD-10-CM

## 2019-03-30 DIAGNOSIS — O98811 Other maternal infectious and parasitic diseases complicating pregnancy, first trimester: Secondary | ICD-10-CM | POA: Diagnosis present

## 2019-03-30 NOTE — Progress Notes (Signed)
Routine Prenatal Care Visit  Subjective  Lisa Solis is a 18 y.o. G1P0000 at 2029w4d being seen today for ongoing prenatal care.  She is currently monitored for the following issues for this low-risk pregnancy and has Supervision of normal first pregnancy, antepartum and Chlamydia infection affecting pregnancy on their problem list.  ----------------------------------------------------------------------------------- Patient reports no complaints.   Contractions: Not present. Vag. Bleeding: None.   . Denies leaking of fluid.  U/S complete and normal. ----------------------------------------------------------------------------------- The following portions of the patient's history were reviewed and updated as appropriate: allergies, current medications, past family history, past medical history, past social history, past surgical history and problem list. Problem list updated.  Objective  Blood pressure 110/68, weight 109 lb (49.4 kg), last menstrual period 10/22/2018. Pregravid weight 108 lb (49 kg) Total Weight Gain 1 lb (0.454 kg) Urinalysis: Urine Protein    Urine Glucose    Fetal Status: Fetal Heart Rate (bpm): Present-normal         General:  Alert, oriented and cooperative. Patient is in no acute distress.  Skin: Skin is warm and dry. No rash noted.   Cardiovascular: Normal heart rate noted  Respiratory: Normal respiratory effort, no problems with respiration noted  Abdomen: Soft, gravid, appropriate for gestational age. Pain/Pressure: Absent     Pelvic:  Cervical exam deferred        Extremities: Normal range of motion.     Mental Status: Normal mood and affect. Normal behavior. Normal judgment and thought content.   Imaging Results [place] Ob Comp + 14 Wk  Result Date: 03/30/2019 Patient Name: Lisa Solis DOB: 11/15/2001 MRN: [id number] ULTRASOUND REPORT Location: [facility] Date of Service: 03/30/2019 Indications:Anatomy Ultrasound Findings: Mason JimSingleton intrauterine pregnancy  is visualized with FHR at 145 BPM. Biometrics give an (U/S) Gestational age of 2053w2d and an (U/S) EDD of 08/08/2019; this correlates with the clinically established Estimated Date of Delivery: 08/06/2019.  Fetal presentation is transverse. EFW: 420 grams (15 oz). Placenta: posterior. Grade: 1 AFI: subjectively normal. Anatomic survey is complete and normal; Gender - female.  Right Ovary is normal in appearance. Left Ovary is normal appearance. Survey of the adnexa demonstrates no adnexal masses. There is no free peritoneal fluid in the cul de sac. Impression: 1. 209w4d Viable Singleton Intrauterine pregnancy by U/S. 2. (U/S) EDD is consistent with Clinically established Estimated Date of Delivery: 08/06/19 . 3. Normal Anatomy Scan Clydene LamingJenine M Alessi, RDMS There is a singleton gestation with subjectively normal amniotic fluid volume. The fetal biometry correlates with established dating. Detailed evaluation of the fetal anatomy was performed.The fetal anatomical survey appears within normal limits within the resolution of ultrasound as described above.  It must be noted that a normal ultrasound is unable to rule out fetal aneuploidy nor is it able to detect all possible malformations.   The ultrasound images and findings were reviewed by me and I agree with the above report. Thomasene MohairStephen Jericho Alcorn, MD, Merlinda FrederickFACOG [facility], [facility] 03/30/2019 9:12 AM       Assessment   17 y.o. G1P0000 at 2079w4d by  08/06/2019, by Ultrasound presenting for routine prenatal visit  Plan   pregnancy Problems (from 01/16/19 to present)    Problem Noted Resolved   Chlamydia infection affecting pregnancy 03/30/2019 by Conard NovakJackson, Oddie Bottger D, MD No   Overview Signed 03/30/2019  9:14 AM by Conard NovakJackson, Farhaan Mabee D, MD    - positive 01/2019 - positive TOC 02/2019 [ ]  TOC 03/2019      Supervision of normal  first pregnancy, antepartum 01/16/2019 by Tresea Mall, CNM No   Overview Addendum 02/21/2019  8:17 AM by Oswaldo Conroy, CNM     Clinic [facility] Prenatal Labs  Dating Ultrasound 12w 2d Blood type: O/Positive/-- (02/19 1158)   Genetic Screen NIPS: Negative XY Antibody:Negative (02/19 1158)  Anatomic [place]  Rubella: 9.26 (02/19 1158) Varicella: Immune  GTT Early:               Third trimester:  RPR: Non Reactive (02/19 1158)   Rhogam  HBsAg: Negative (02/19 1158)   TDaP vaccine   Flu Shot: Nov 2019 HIV: Non Reactive (02/19 1158)   Baby Food                                GBS:   Contraception  Pap: N/A < 21  CBB     CS/VBAC NA   Support Person Michael           Preterm labor symptoms and general obstetric precautions including but not limited to vaginal bleeding, contractions, leaking of fluid and fetal movement were reviewed in detail with the patient. Please refer to After Visit Summary for other counseling recommendations.   - Chlamydia TOC today  Return in about 4 weeks (around 04/27/2019) for Routine Prenatal Appointment/telephone.  Thomasene Mohair, MD, [facility], [facility] 03/30/2019 9:21 AM

## 2019-04-03 LAB — CERVICOVAGINAL ANCILLARY ONLY
Chlamydia: POSITIVE — AB
Neisseria Gonorrhea: NEGATIVE

## 2019-04-10 ENCOUNTER — Other Ambulatory Visit: Payer: Self-pay | Admitting: Obstetrics and Gynecology

## 2019-04-10 ENCOUNTER — Telehealth: Payer: Self-pay | Admitting: Obstetrics and Gynecology

## 2019-04-10 DIAGNOSIS — Z3400 Encounter for supervision of normal first pregnancy, unspecified trimester: Secondary | ICD-10-CM

## 2019-04-10 DIAGNOSIS — O98812 Other maternal infectious and parasitic diseases complicating pregnancy, second trimester: Secondary | ICD-10-CM

## 2019-04-10 DIAGNOSIS — A749 Chlamydial infection, unspecified: Secondary | ICD-10-CM

## 2019-04-10 MED ORDER — AZITHROMYCIN 200 MG/5ML PO SUSR: 1000.0000 mg | Freq: Every day | ORAL | 0 refills | Status: AC

## 2019-04-10 NOTE — Telephone Encounter (Signed)
Discussed chlamydia infection. Will treat with non-pill form as she states she has trouble getting it down. Will perform TOC in early June. SHe also was made aware that her partner(s) and any partner(s) he may have need to be treated, as well. Refrain from sex for 1 week after treatment. She voiced understanding and agreement with the plan.

## 2019-04-27 ENCOUNTER — Other Ambulatory Visit: Payer: Self-pay

## 2019-04-27 ENCOUNTER — Encounter: Payer: Self-pay | Admitting: Maternal Newborn

## 2019-04-27 ENCOUNTER — Encounter: Payer: Managed Care, Other (non HMO) | Admitting: Obstetrics and Gynecology

## 2019-04-27 ENCOUNTER — Ambulatory Visit (INDEPENDENT_AMBULATORY_CARE_PROVIDER_SITE_OTHER): Payer: Managed Care, Other (non HMO) | Admitting: Maternal Newborn

## 2019-04-27 VITALS — BP 110/60 | Wt 114.0 lb

## 2019-04-27 DIAGNOSIS — Z3400 Encounter for supervision of normal first pregnancy, unspecified trimester: Secondary | ICD-10-CM

## 2019-04-27 DIAGNOSIS — Z3A25 25 weeks gestation of pregnancy: Secondary | ICD-10-CM

## 2019-04-27 DIAGNOSIS — Z3402 Encounter for supervision of normal first pregnancy, second trimester: Secondary | ICD-10-CM

## 2019-04-27 LAB — POCT URINALYSIS DIPSTICK OB
Glucose, UA: NEGATIVE
POC,PROTEIN,UA: NEGATIVE

## 2019-04-27 NOTE — Progress Notes (Signed)
ROB- no concerns 

## 2019-04-27 NOTE — Patient Instructions (Signed)

## 2019-04-27 NOTE — Progress Notes (Signed)
    Routine Prenatal Care Visit  Subjective  Zarinah J [place] is a 18 y.o. G1P0000 at [redacted]w[redacted]d being seen today for ongoing prenatal care.  She is currently monitored for the following issues for this low-risk pregnancy and has Supervision of normal first pregnancy, antepartum and Chlamydia infection affecting pregnancy on their problem list.  ----------------------------------------------------------------------------------- Patient reports occasional sharp pain on the left side of her back, consistent with muscle spasm. Usually happens 1-3 times per day. Resolves on its own. Contractions: Not present. Vag. Bleeding: None.  Movement: Present. No leaking of fluid.  ----------------------------------------------------------------------------------- The following portions of the patient's history were reviewed and updated as appropriate: allergies, current medications, past family history, past medical history, past social history, past surgical history and problem list. Problem list updated.   Objective  Blood pressure 110/60, weight 114 lb (51.7 kg), last menstrual period 10/22/2018. Pregravid weight 108 lb (49 kg) Total Weight Gain 6 lb (2.722 kg) Urinalysis: Urine dipstick shows negative for glucose, protein. Fetal Status: Fetal Heart Rate (bpm): 139   Movement: Present     General:  Alert, oriented and cooperative. Patient is in no acute distress.  Skin: Skin is warm and dry. No rash noted.   Cardiovascular: Normal heart rate noted  Respiratory: Normal respiratory effort, no problems with respiration noted  Abdomen: Soft, gravid, appropriate for gestational age. Pain/Pressure: Absent     Pelvic:  Cervical exam deferred        Extremities: Normal range of motion.     Mental Status: Normal mood and affect. Normal behavior. Normal judgment and thought content.     Assessment   18 y.o. G1P0000 at [redacted]w[redacted]d, EDD 08/06/2019 by Ultrasound presenting for a routine prenatal visit.  Plan    pregnancy Problems (from 01/16/19 to present)    Problem Noted Resolved   Chlamydia infection affecting pregnancy 03/30/2019 by Conard Novak, MD No   Overview Signed 03/30/2019  9:14 AM by Conard Novak, MD    - positive 01/2019 - positive TOC 02/2019 [ ]  TOC 03/2019      Supervision of normal first pregnancy, antepartum 01/16/2019 by Tresea Mall, CNM No   Overview Addendum 02/21/2019  8:17 AM by Oswaldo Conroy, CNM    Clinic [facility] Prenatal Labs  Dating Ultrasound 12w 2d Blood type: O/Positive/-- (02/19 1158)   Genetic Screen NIPS: Negative XY Antibody:Negative (02/19 1158)  Anatomic [place]  Rubella: 9.26 (02/19 1158) Varicella: Immune  GTT Early:               Third trimester:  RPR: Non Reactive (02/19 1158)   Rhogam  HBsAg: Negative (02/19 1158)   TDaP vaccine   Flu Shot: Nov 2019 HIV: Non Reactive (02/19 1158)   Baby Food                                GBS:   Contraception  Pap: N/A < 21  CBB     CS/VBAC NA   Support Person Casimiro Needle           Discussed comfort measures for back pain.  Please refer to After Visit Summary for other counseling recommendations.   Return in about 3 weeks (around 05/18/2019) for ROB with GTT/labs.  Marcelyn Bruins, CNM 04/27/2019  4:42 PM

## 2019-05-18 ENCOUNTER — Ambulatory Visit (INDEPENDENT_AMBULATORY_CARE_PROVIDER_SITE_OTHER): Payer: Managed Care, Other (non HMO) | Admitting: Obstetrics and Gynecology

## 2019-05-18 ENCOUNTER — Telehealth: Payer: Self-pay

## 2019-05-18 ENCOUNTER — Other Ambulatory Visit: Payer: Self-pay

## 2019-05-18 ENCOUNTER — Other Ambulatory Visit: Payer: Managed Care, Other (non HMO)

## 2019-05-18 VITALS — BP 110/66 | Wt 115.0 lb

## 2019-05-18 DIAGNOSIS — Z3400 Encounter for supervision of normal first pregnancy, unspecified trimester: Secondary | ICD-10-CM

## 2019-05-18 DIAGNOSIS — Z3403 Encounter for supervision of normal first pregnancy, third trimester: Secondary | ICD-10-CM

## 2019-05-18 DIAGNOSIS — Z3A28 28 weeks gestation of pregnancy: Secondary | ICD-10-CM

## 2019-05-18 LAB — POCT URINALYSIS DIPSTICK OB
Glucose, UA: NEGATIVE
POC,PROTEIN,UA: NEGATIVE

## 2019-05-18 NOTE — Addendum Note (Signed)
Addended by: [place], Mumin Denomme B on: 05/18/2019 10:13 AM   Modules accepted: Orders

## 2019-05-18 NOTE — Telephone Encounter (Signed)
Advise

## 2019-05-18 NOTE — Telephone Encounter (Signed)
Pt calling; is having issuee getting time off work for appt on 6/26 at 11:30.  Needs doctor's note.  [phone]

## 2019-05-18 NOTE — Progress Notes (Signed)
ROB 28 week labs TDAP @ [facility] d/t age pt aware

## 2019-05-18 NOTE — Progress Notes (Signed)
    Routine Prenatal Care Visit  Subjective  Lisa Solis is a 18 y.o. G1P0000 at [redacted]w[redacted]d being seen today for ongoing prenatal care.  She is currently monitored for the following issues for this low-risk pregnancy and has Supervision of normal first pregnancy, antepartum and Chlamydia infection affecting pregnancy on their problem list.  ----------------------------------------------------------------------------------- Patient reports no complaints.   Contractions: Not present. Vag. Bleeding: None.  Movement: Present. Denies leaking of fluid.  ----------------------------------------------------------------------------------- The following portions of the patient's history were reviewed and updated as appropriate: allergies, current medications, past family history, past medical history, past social history, past surgical history and problem list. Problem list updated.   Objective  Blood pressure 110/66, weight 115 lb (52.2 kg), last menstrual period 10/22/2018. Pregravid weight 108 lb (49 kg) Total Weight Gain 7 lb (3.175 kg) Urinalysis:      Fetal Status: Fetal Heart Rate (bpm): 145 Fundal Height: 27 cm Movement: Present     General:  Alert, oriented and cooperative. Patient is in no acute distress.  Skin: Skin is warm and dry. No rash noted.   Cardiovascular: Normal heart rate noted  Respiratory: Normal respiratory effort, no problems with respiration noted  Abdomen: Soft, gravid, appropriate for gestational age. Pain/Pressure: Absent     Pelvic:  Cervical exam deferred        Extremities: Normal range of motion.     ental Status: Normal mood and affect. Normal behavior. Normal judgment and thought content.     Assessment   18 y.o. G1P0000 at [redacted]w[redacted]d by  08/06/2019, by Ultrasound presenting for routine prenatal visit  Plan   pregnancy Problems (from 01/16/19 to present)    Problem Noted Resolved   Chlamydia infection affecting pregnancy 03/30/2019 by Will Bonnet, MD No    Overview Signed 03/30/2019  9:14 AM by Will Bonnet, MD    - positive 01/2019 - positive TOC 02/2019 [ ]  TOC 03/2019      Supervision of normal first pregnancy, antepartum 01/16/2019 by Rod Can, CNM No   Overview Addendum 04/27/2019  4:44 PM by Rexene Agent, [place] Prenatal Labs  Dating Ultrasound 12w 2d Blood type: O/Positive/-- (02/19 1158)   Genetic Screen NIPS: Negative XY Antibody:Negative (02/19 1158)  Anatomic [place] Complete/normal 03/30/2019 Rubella: 9.26 (02/19 1158) Varicella: Immune  GTT Early:               Third trimester:  RPR: Non Reactive (02/19 1158)   Rhogam N/A HBsAg: Negative (02/19 1158)   TDaP vaccine   Flu Shot: Nov 2019 HIV: Non Reactive (02/19 1158)   Baby Food                                GBS:   Contraception  Pap: N/A < 21  CBB     CS/VBAC NA   Support Person Legrand Como              Gestational age appropriate obstetric precautions including but not limited to vaginal bleeding, contractions, leaking of fluid and fetal movement were reviewed in detail with the patient.    Return in about 2 weeks (around 06/01/2019) for ROB.  Malachy Mood, MD, [facility], [facility] 05/18/2019, 10:11 AM

## 2019-05-19 LAB — 28 WEEK RH+PANEL
Basophils Absolute: 0 10*3/uL (ref 0.0–0.2)
Basos: 0 %
EOS (ABSOLUTE): 0.1 10*3/uL (ref 0.0–0.4)
Eos: 1 %
Gestational Diabetes Screen: 74 mg/dL (ref 65–139)
HIV Screen 4th Generation wRfx: NONREACTIVE
Hematocrit: 31.9 % — ABNORMAL LOW (ref 34.0–46.6)
Hemoglobin: 10.7 g/dL — ABNORMAL LOW (ref 11.1–15.9)
Immature Grans (Abs): 0 10*3/uL (ref 0.0–0.1)
Immature Granulocytes: 1 %
Lymphocytes Absolute: 1.7 10*3/uL (ref 0.7–3.1)
Lymphs: 27 %
MCH: 26.6 pg (ref 26.6–33.0)
MCHC: 33.5 g/dL (ref 31.5–35.7)
MCV: 79 fL (ref 79–97)
Monocytes Absolute: 0.4 10*3/uL (ref 0.1–0.9)
Monocytes: 6 %
Neutrophils Absolute: 4.1 10*3/uL (ref 1.4–7.0)
Neutrophils: 65 %
Platelets: 240 10*3/uL (ref 150–450)
RBC: 4.03 x10E6/uL (ref 3.77–5.28)
RDW: 13.3 % (ref 11.7–15.4)
RPR Ser Ql: NONREACTIVE
WBC: 6.3 10*3/uL (ref 3.4–10.8)

## 2019-05-20 ENCOUNTER — Encounter: Payer: Self-pay | Admitting: Obstetrics and Gynecology

## 2019-05-20 NOTE — Telephone Encounter (Signed)
Not in chart

## 2019-05-21 NOTE — Telephone Encounter (Signed)
Pt aware, via voicemail, letter is in chart

## 2019-06-01 ENCOUNTER — Other Ambulatory Visit (HOSPITAL_COMMUNITY)
Admission: RE | Admit: 2019-06-01 | Discharge: 2019-06-01 | Disposition: A | Discharge status: Home / Self Care | Payer: Managed Care, Other (non HMO) | Source: Ambulatory Visit | Attending: Certified Nurse Midwife | Admitting: Certified Nurse Midwife

## 2019-06-01 ENCOUNTER — Other Ambulatory Visit: Payer: Self-pay

## 2019-06-01 ENCOUNTER — Ambulatory Visit (INDEPENDENT_AMBULATORY_CARE_PROVIDER_SITE_OTHER): Payer: Managed Care, Other (non HMO) | Admitting: Certified Nurse Midwife

## 2019-06-01 VITALS — BP 106/60 | Wt 113.4 lb

## 2019-06-01 DIAGNOSIS — Z3A30 30 weeks gestation of pregnancy: Secondary | ICD-10-CM | POA: Insufficient documentation

## 2019-06-01 DIAGNOSIS — O98813 Other maternal infectious and parasitic diseases complicating pregnancy, third trimester: Secondary | ICD-10-CM

## 2019-06-01 DIAGNOSIS — Z3400 Encounter for supervision of normal first pregnancy, unspecified trimester: Secondary | ICD-10-CM

## 2019-06-01 DIAGNOSIS — O2613 Low weight gain in pregnancy, third trimester: Secondary | ICD-10-CM

## 2019-06-01 DIAGNOSIS — A749 Chlamydial infection, unspecified: Secondary | ICD-10-CM

## 2019-06-01 LAB — POCT URINALYSIS DIPSTICK OB
Glucose, UA: NEGATIVE
POC,PROTEIN,UA: NEGATIVE

## 2019-06-01 NOTE — Progress Notes (Signed)
ROB/BT consent form today- no concerns

## 2019-06-02 DIAGNOSIS — O2613 Low weight gain in pregnancy, third trimester: Secondary | ICD-10-CM | POA: Insufficient documentation

## 2019-06-02 NOTE — Progress Notes (Signed)
ROB at 30wk4d: Feeling well. Baby active. Usually eats once a day then grazes the rest of the time.Weight gain is 5# Taking prenatal gummys. Advised to start [employer] Complete to get iron since hct was 31.9%  FH 30 cm. Will send order for Boost BID to [facility] and get growth scan at next visit. Got TDAp at [facility] yesterday. Blood transfusion consent signed today. Chlamydia/ GC test of cure today ROB and growth scan in 2 weeks.  Dalia Heading, CNM

## 2019-06-05 LAB — CERVICOVAGINAL ANCILLARY ONLY
Chlamydia: POSITIVE — AB
Neisseria Gonorrhea: NEGATIVE

## 2019-06-08 NOTE — Telephone Encounter (Signed)
Called patient on 7/1 and 7/3, and left message to call regarding lab results. ALso left message on Mychart today to contact me. Dalia Heading, CNM

## 2019-06-15 ENCOUNTER — Encounter: Payer: Self-pay | Admitting: Advanced Practice Midwife

## 2019-06-15 ENCOUNTER — Other Ambulatory Visit: Payer: Self-pay

## 2019-06-15 ENCOUNTER — Ambulatory Visit (INDEPENDENT_AMBULATORY_CARE_PROVIDER_SITE_OTHER): Payer: Managed Care, Other (non HMO) | Admitting: Advanced Practice Midwife

## 2019-06-15 ENCOUNTER — Ambulatory Visit (INDEPENDENT_AMBULATORY_CARE_PROVIDER_SITE_OTHER): Payer: Managed Care, Other (non HMO)

## 2019-06-15 VITALS — BP 102/72 | Wt 115.0 lb

## 2019-06-15 DIAGNOSIS — Z362 Encounter for other antenatal screening follow-up: Secondary | ICD-10-CM | POA: Diagnosis not present

## 2019-06-15 DIAGNOSIS — Z3A32 32 weeks gestation of pregnancy: Secondary | ICD-10-CM

## 2019-06-15 DIAGNOSIS — O98813 Other maternal infectious and parasitic diseases complicating pregnancy, third trimester: Secondary | ICD-10-CM

## 2019-06-15 DIAGNOSIS — Z3400 Encounter for supervision of normal first pregnancy, unspecified trimester: Secondary | ICD-10-CM

## 2019-06-15 DIAGNOSIS — A749 Chlamydial infection, unspecified: Secondary | ICD-10-CM

## 2019-06-15 DIAGNOSIS — O2613 Low weight gain in pregnancy, third trimester: Secondary | ICD-10-CM

## 2019-06-15 LAB — POCT URINALYSIS DIPSTICK OB
Glucose, UA: NEGATIVE
POC,PROTEIN,UA: NEGATIVE

## 2019-06-15 MED ORDER — AZITHROMYCIN 500 MG PO TABS: 1000.0000 mg | ORAL_TABLET | Freq: Once | ORAL | 0 refills | Status: AC

## 2019-06-15 NOTE — Progress Notes (Signed)
Have left multiple telephone messages for patient and a [facility] message, but patient has never returned call. She is scheduled for an appointment this AM. Please treat her and her partner. If she no shows, please let me know so that I can send her a letter in the mail. Thanks, [employer]

## 2019-06-15 NOTE — Progress Notes (Signed)
Routine Prenatal Care Visit  Subjective  Lisa Solis is a 18 y.o. G1P0000 at [redacted]w[redacted]d being seen today for ongoing prenatal care.  She is currently monitored for the following issues for this low-risk pregnancy and has Supervision of normal first pregnancy, antepartum; Chlamydia infection affecting pregnancy; and Poor weight gain of pregnancy, third trimester on their problem list.  ----------------------------------------------------------------------------------- Patient reports no complaints.  She has gotten Boost [facility]. She is aware of need to treat Chlamydia infection. She has questions regarding [employer]. Reassurance given that she tested negative earlier in the pregnancy and slight elevation in Factor VIII could be a normal response in pregnancy. Contractions: Not present. Vag. Bleeding: None.  Movement: Present. Denies leaking of fluid.  ----------------------------------------------------------------------------------- The following portions of the patient's history were reviewed and updated as appropriate: allergies, current medications, past family history, past medical history, past social history, past surgical history and problem list. Problem list updated.   Objective  Blood pressure 102/72, weight 115 lb (52.2 kg), last menstrual period 10/22/2018. Pregravid weight 108 lb (49 kg) Total Weight Gain 7 lb (3.175 kg) Urinalysis: Urine Protein Negative  Urine Glucose Negative  Fetal Status: Fetal Heart Rate (bpm): 133 Fundal Height: 32 cm Movement: Present  Presentation: Vertex   Growth scan today: 32.8%, 4 pounds 1 ounce, AFI 12.5 cm  General:  Alert, oriented and cooperative. Patient is in no acute distress.  Skin: Skin is warm and dry. No rash noted.   Cardiovascular: Normal heart rate noted  Respiratory: Normal respiratory effort, no problems with respiration noted  Abdomen: Soft, gravid, appropriate for gestational age. Pain/Pressure: Absent     Pelvic:   Cervical exam deferred        Extremities: Normal range of motion.  Edema: None  Mental Status: Normal mood and affect. Normal behavior. Normal judgment and thought content.   Assessment   18 y.o. G1P0000 at [redacted]w[redacted]d by  08/06/2019, by Ultrasound presenting for routine prenatal visit  Plan   pregnancy Problems (from 01/16/19 to present)    Problem Noted Resolved   Chlamydia infection affecting pregnancy 03/30/2019 by Will Bonnet, MD No   Overview Signed 03/30/2019  9:14 AM by Will Bonnet, MD    - positive 01/2019 - positive TOC 02/2019 [ ]  TOC 03/2019      Supervision of normal first pregnancy, antepartum 01/16/2019 by Rod Can, CNM No   Overview Addendum 04/27/2019  4:44 PM by Rexene Agent, [facility] Prenatal Labs  Dating Ultrasound 12w 2d Blood type: O/Positive/-- (02/19 1158)   Genetic Screen NIPS: Negative XY Antibody:Negative (02/19 1158)  Anatomic [place] Complete/normal 03/30/2019 Rubella: 9.26 (02/19 1158) Varicella: Immune  GTT Early:               Third trimester:  RPR: Non Reactive (02/19 1158)   Rhogam N/A HBsAg: Negative (02/19 1158)   TDaP vaccine   Flu Shot: Nov 2019 HIV: Non Reactive (02/19 1158)   Baby Food                                GBS:   Contraception  Pap: N/A < 21  CBB     CS/VBAC NA   Support Person Michael              Preterm labor symptoms and general obstetric precautions including but not limited to vaginal bleeding, contractions, leaking of fluid and fetal movement  were reviewed in detail with the patient. Please refer to After Visit Summary for other counseling recommendations.   Take azithromycin as prescribed and also have partner take dose- precautions given and will do TOC in 3-4 weeks Stay hydrated  Return in about 2 weeks (around 06/29/2019) for rob.  Tresea MallJane Lavaughn Haberle, CNM 06/15/2019 9:51 AM

## 2019-06-15 NOTE — Progress Notes (Signed)
ROB Growth Scan 

## 2019-06-29 ENCOUNTER — Other Ambulatory Visit: Payer: Self-pay

## 2019-06-29 ENCOUNTER — Encounter: Payer: Self-pay | Admitting: Advanced Practice Midwife

## 2019-06-29 ENCOUNTER — Ambulatory Visit (INDEPENDENT_AMBULATORY_CARE_PROVIDER_SITE_OTHER): Payer: Managed Care, Other (non HMO) | Admitting: Advanced Practice Midwife

## 2019-06-29 VITALS — BP 116/74 | Wt 118.0 lb

## 2019-06-29 DIAGNOSIS — O98813 Other maternal infectious and parasitic diseases complicating pregnancy, third trimester: Secondary | ICD-10-CM

## 2019-06-29 DIAGNOSIS — Z3A34 34 weeks gestation of pregnancy: Secondary | ICD-10-CM

## 2019-06-29 LAB — POCT URINALYSIS DIPSTICK OB
Glucose, UA: NEGATIVE
POC,PROTEIN,UA: NEGATIVE

## 2019-06-29 NOTE — Addendum Note (Signed)
Addended by: [place], Aubriella Perezgarcia B on: 06/29/2019 09:30 AM   Modules accepted: Orders

## 2019-06-29 NOTE — Progress Notes (Signed)
Routine Prenatal Care Visit  Subjective  Lisa Solis is a 18 y.o. G1P0000 at [redacted]w[redacted]d being seen today for ongoing prenatal care.  She is currently monitored for the following issues for this low-risk pregnancy and has Supervision of normal first pregnancy, antepartum; Chlamydia infection affecting pregnancy; and Poor weight gain of pregnancy, third trimester on their problem list.  ----------------------------------------------------------------------------------- Patient reports some pelvic pressure and low back pain.   Contractions: Not present. Vag. Bleeding: None.  Movement: Present. Denies leaking of fluid.  ----------------------------------------------------------------------------------- The following portions of the patient's history were reviewed and updated as appropriate: allergies, current medications, past family history, past medical history, past social history, past surgical history and problem list. Problem list updated.   Objective  Blood pressure 116/74, weight 118 lb (53.5 kg), last menstrual period 10/22/2018. Pregravid weight 108 lb (49 kg) Total Weight Gain 10 lb (4.536 kg) Urinalysis: Urine Protein    Urine Glucose    Fetal Status: Fetal Heart Rate (bpm): 139 Fundal Height: 34 cm Movement: Present     General:  Alert, oriented and cooperative. Patient is in no acute distress.  Skin: Skin is warm and dry. No rash noted.   Cardiovascular: Normal heart rate noted  Respiratory: Normal respiratory effort, no problems with respiration noted  Abdomen: Soft, gravid, appropriate for gestational age. Pain/Pressure: Present     Pelvic:  Cervical exam deferred        Extremities: Normal range of motion.  Edema: None  Mental Status: Normal mood and affect. Normal behavior. Normal judgment and thought content.   Assessment   18 y.o. G1P0000 at [redacted]w[redacted]d by  08/06/2019, by Ultrasound presenting for routine prenatal visit  Plan   pregnancy Problems (from 01/16/19 to present)     Problem Noted Resolved   Chlamydia infection affecting pregnancy 03/30/2019 by Will Bonnet, MD No   Overview Signed 03/30/2019  9:14 AM by Will Bonnet, MD    - positive 01/2019 - positive TOC 02/2019 [ ]  TOC 03/2019      Supervision of normal first pregnancy, antepartum 01/16/2019 by Rod Can, CNM No   Overview Addendum 04/27/2019  4:44 PM by Rexene Agent, [place] Prenatal Labs  Dating Ultrasound 12w 2d Blood type: O/Positive/-- (02/19 1158)   Genetic Screen NIPS: Negative XY Antibody:Negative (02/19 1158)  Anatomic [place] Complete/normal 03/30/2019 Rubella: 9.26 (02/19 1158) Varicella: Immune  GTT Early:               Third trimester:  RPR: Non Reactive (02/19 1158)   Rhogam N/A HBsAg: Negative (02/19 1158)   TDaP vaccine   Flu Shot: Nov 2019 HIV: Non Reactive (02/19 1158)   Baby Food                                GBS:   Contraception  Pap: N/A < 21  CBB     CS/VBAC NA   Support Person Michael              Preterm labor symptoms and general obstetric precautions including but not limited to vaginal bleeding, contractions, leaking of fluid and fetal movement were reviewed in detail with the patient. Please refer to After Visit Summary for other counseling recommendations.  Back pain: abdominal support band, stretches, soak in tub Stay hydrated  Return in about 2 weeks (around 07/13/2019) for rob.  Rod Can, CNM 06/29/2019 9:16 AM

## 2019-06-29 NOTE — Progress Notes (Signed)
ROB

## 2019-07-02 ENCOUNTER — Other Ambulatory Visit: Payer: Self-pay

## 2019-07-02 ENCOUNTER — Emergency Department
Admission: EM | Admit: 2019-07-02 | Discharge: 2019-07-02 | Disposition: A | Discharge status: Home / Self Care | Payer: Managed Care, Other (non HMO) | Attending: Emergency Medicine | Admitting: Emergency Medicine

## 2019-07-02 ENCOUNTER — Emergency Department: Payer: Managed Care, Other (non HMO)

## 2019-07-02 DIAGNOSIS — Z87891 Personal history of nicotine dependence: Secondary | ICD-10-CM | POA: Diagnosis not present

## 2019-07-02 DIAGNOSIS — M79604 Pain in right leg: Secondary | ICD-10-CM | POA: Insufficient documentation

## 2019-07-02 LAB — BASIC METABOLIC PANEL
Anion gap: 10 (ref 5–15)
BUN: 13 mg/dL (ref 6–20)
CO2: 19 mmol/L — ABNORMAL LOW (ref 22–32)
Calcium: 8.6 mg/dL — ABNORMAL LOW (ref 8.9–10.3)
Chloride: 106 mmol/L (ref 98–111)
Creatinine, Ser: 0.41 mg/dL — ABNORMAL LOW (ref 0.44–1.00)
GFR calc Af Amer: >60 mL/min (ref >60)
GFR calc non Af Amer: >60 mL/min (ref >60)
Glucose, Bld: 88 mg/dL (ref 70–99)
Potassium: 3.6 mmol/L (ref 3.5–5.1)
Sodium: 135 mmol/L (ref 135–145)

## 2019-07-02 LAB — CBC WITH DIFFERENTIAL/PLATELET
Abs Immature Granulocytes: 0.04 10*3/uL (ref 0.00–0.07)
Basophils Absolute: 0 10*3/uL (ref 0.0–0.1)
Basophils Relative: 0 %
Eosinophils Absolute: 0.1 10*3/uL (ref 0.0–0.5)
Eosinophils Relative: 1 %
HCT: 32.6 % — ABNORMAL LOW (ref 36.0–46.0)
Hemoglobin: 10.8 g/dL — ABNORMAL LOW (ref 12.0–15.0)
Immature Granulocytes: 1 %
Lymphocytes Relative: 29 %
Lymphs Abs: 2.5 10*3/uL (ref 0.7–4.0)
MCH: 26.5 pg (ref 26.0–34.0)
MCHC: 33.1 g/dL (ref 30.0–36.0)
MCV: 79.9 fL — ABNORMAL LOW (ref 80.0–100.0)
Monocytes Absolute: 0.5 10*3/uL (ref 0.1–1.0)
Monocytes Relative: 6 %
Neutro Abs: 5.5 10*3/uL (ref 1.7–7.7)
Neutrophils Relative %: 63 %
Platelets: 222 10*3/uL (ref 150–400)
RBC: 4.08 MIL/uL (ref 3.87–5.11)
RDW: 13.2 % (ref 11.5–15.5)
WBC: 8.7 10*3/uL (ref 4.0–10.5)
nRBC: 0 % (ref 0.0–0.2)

## 2019-07-02 NOTE — ED Provider Notes (Signed)
[facility] Emergency Department Provider Note   ____________________________________________   I have reviewed the triage vital signs and the nursing notes.   HISTORY  Chief Complaint Leg Swelling   History limited by: Not Limited   HPI Lisa Solis is a 18 y.o. female who presents to the emergency department today because of concern for right leg swelling and pain. She is 8 months pregnant. The patient states that she has had symptoms for roughly the past 2 weeks. She noticed some bruising to her calf. She denies any known trauma. She says that she will get a sharp pain to her calf. The patient denies any chest pain. Has had some shortness of breath. There is family history of blood clots.   Records reviewed. Per medical record review patient has a history of ovarian cyst.   Past Medical History:  Diagnosis Date  . Ovarian cyst     Patient Active Problem List   Diagnosis Date Noted  . Poor weight gain of pregnancy, third trimester 06/02/2019  . Chlamydia infection affecting pregnancy 03/30/2019  . Supervision of normal first pregnancy, antepartum 01/16/2019    History reviewed. No pertinent surgical history.  Prior to Admission medications   Not on File    Allergies Patient has no known allergies.  Family History  Problem Relation Age of Onset  . Hypertension Mother     Social History Social History   Tobacco Use  . Smoking status: Former Smoker    Types: E-cigarettes  . Smokeless tobacco: Never Used  Substance Use Topics  . Alcohol use: No  . Drug use: Never    Review of Systems Constitutional: No fever/chills Eyes: No visual changes. ENT: No sore throat. Cardiovascular: Denies chest pain. Respiratory: Positive for shortness of breath. Gastrointestinal: No abdominal pain.  No nausea, no vomiting.  No diarrhea.   Genitourinary: Negative for dysuria. Musculoskeletal: Positive for right leg pain and swelling.  Skin:  Negative for rash. Neurological: Negative for headaches, focal weakness or numbness.  ____________________________________________   PHYSICAL EXAM:  VITAL SIGNS: ED Triage Vitals  Enc Vitals Group     BP 07/02/19 1806 (!) 98/58     Pulse Rate 07/02/19 1806 64     Resp 07/02/19 1806 17     Temp 07/02/19 1806 98.3 F (36.8 C)     Temp Source 07/02/19 1806 Oral     SpO2 07/02/19 1806 98 %     Weight 07/02/19 1801 118 lb (53.5 kg)     Height 07/02/19 1801 4\' 11"  (1.499 m)     Head Circumference --      Peak Flow --      Pain Score 07/02/19 1801 0    Constitutional: Alert and oriented.  Eyes: Conjunctivae are normal.  ENT      Head: Normocephalic and atraumatic.      Nose: No congestion/rhinnorhea.      Mouth/Throat: Mucous membranes are moist.      Neck: No stridor. Hematological/Lymphatic/Immunilogical: No cervical lymphadenopathy. Cardiovascular: Normal rate, regular rhythm.  No murmurs, rubs, or gallops.  Respiratory: Normal respiratory effort without tachypnea nor retractions. Breath sounds are clear and equal bilaterally. No wheezes/rales/rhonchi. Gastrointestinal: Soft and non tender. No rebound. No guarding.  Genitourinary: Deferred Musculoskeletal: Normal range of motion in all extremities. No lower extremity edema. DP 2+ bilaterally. No discoloration.  Neurologic:  Normal speech and language. No gross focal neurologic deficits are appreciated.  Skin:  Small bruise to right calf.  Psychiatric: Mood and  affect are normal. Speech and behavior are normal. Patient exhibits appropriate insight and judgment.  ____________________________________________    LABS (pertinent positives/negatives)  BMP wnl except co 19, cr 0.41, ca 8.6 CBC wbc 8.7, hgb 10.8, plt 222  ____________________________________________   EKG  None  ____________________________________________    RADIOLOGY  US Right lower No DVT. Question sluggishness in  vein ____________________________________________   PROCEDURES  Procedures  ____________________________________________   INITIAL IMPRESSION / ASSESSMENT AND PLAN / ED COURSE  Pertinent labs & imaging results that were available during my care of the patient were reviewed by me and considered in my medical decision making (see chart for details).   Patient presented to the emergency department today because of concerns for right leg pain and possible blood clot.  Patient is 8 months pregnant.  On exam patient has a small bruise to the right calf although I do not appreciate any swelling.  No significant tenderness.  Ultrasound was performed which did not show any blood clot but question some sluggishness in the veins.  Blood work was checked without any concerning abnormalities.  Patient follow-up with OB/GYN.  ____________________________________________   FINAL CLINICAL IMPRESSION(S) / ED DIAGNOSES  Final diagnoses:  Right leg pain     Note: This dictation was prepared with Dragon dictation. Any transcriptional errors that result from this process are unintentional     Phineas SemenGoodman, Jenisis Harmsen, MD 07/02/19 2149

## 2019-07-02 NOTE — Discharge Instructions (Addendum)
Please seek medical attention for any high fevers, chest pain, shortness of breath, change in behavior, persistent vomiting, bloody stool or any other new or concerning symptoms.  

## 2019-07-02 NOTE — ED Triage Notes (Signed)
Pt c/o right leg swelling and pain since this afternoon and feels a knot behind her knee. Pt is [redacted] weeks pregnant and is concerned for a DVT because it runs in the family

## 2019-07-13 ENCOUNTER — Other Ambulatory Visit (HOSPITAL_COMMUNITY)
Admission: RE | Admit: 2019-07-13 | Discharge: 2019-07-13 | Disposition: A | Discharge status: Home / Self Care | Payer: Managed Care, Other (non HMO) | Source: Ambulatory Visit | Attending: Advanced Practice Midwife | Admitting: Advanced Practice Midwife

## 2019-07-13 ENCOUNTER — Encounter: Payer: Self-pay | Admitting: Advanced Practice Midwife

## 2019-07-13 ENCOUNTER — Other Ambulatory Visit: Payer: Self-pay

## 2019-07-13 ENCOUNTER — Ambulatory Visit (INDEPENDENT_AMBULATORY_CARE_PROVIDER_SITE_OTHER): Payer: Managed Care, Other (non HMO) | Admitting: Advanced Practice Midwife

## 2019-07-13 VITALS — BP 90/60 | Wt 118.8 lb

## 2019-07-13 DIAGNOSIS — O98313 Other infections with a predominantly sexual mode of transmission complicating pregnancy, third trimester: Secondary | ICD-10-CM

## 2019-07-13 DIAGNOSIS — Z113 Encounter for screening for infections with a predominantly sexual mode of transmission: Secondary | ICD-10-CM

## 2019-07-13 DIAGNOSIS — Z3A36 36 weeks gestation of pregnancy: Secondary | ICD-10-CM | POA: Insufficient documentation

## 2019-07-13 DIAGNOSIS — Z3685 Encounter for antenatal screening for Streptococcus B: Secondary | ICD-10-CM

## 2019-07-13 LAB — POCT URINALYSIS DIPSTICK OB
Glucose, UA: NEGATIVE
POC,PROTEIN,UA: NEGATIVE

## 2019-07-13 NOTE — Progress Notes (Signed)
Routine Prenatal Care Visit  Subjective  Lisa Solis is a 18 y.o. G1P0000 at [redacted]w[redacted]d being seen today for ongoing prenatal care.  She is currently monitored for the following issues for this low-risk pregnancy and has Supervision of normal first pregnancy, antepartum; Chlamydia infection affecting pregnancy; and Poor weight gain of pregnancy, third trimester on their problem list.  ----------------------------------------------------------------------------------- Patient reports no complaints.  She requests cervical exam today. Patient plans to formula feed. Initiation of breastfeeding for colostrum encouraged. Contractions: Not present. Vag. Bleeding: None.  Movement: Present. Denies leaking of fluid.  ----------------------------------------------------------------------------------- The following portions of the patient's history were reviewed and updated as appropriate: allergies, current medications, past family history, past medical history, past social history, past surgical history and problem list. Problem list updated.   Objective  Blood pressure 90/60, weight 118 lb 12.8 oz (53.9 kg), last menstrual period 10/22/2018. Pregravid weight 108 lb (49 kg) Total Weight Gain 10 lb 12.8 oz (4.899 kg) Urinalysis: Urine Protein Negative  Urine Glucose Negative  Fetal Status: Fetal Heart Rate (bpm): 120 Fundal Height: 36 cm Movement: Present     General:  Alert, oriented and cooperative. Patient is in no acute distress.  Skin: Skin is warm and dry. No rash noted.   Cardiovascular: Normal heart rate noted  Respiratory: Normal respiratory effort, no problems with respiration noted  Abdomen: Soft, gravid, appropriate for gestational age. Pain/Pressure: Present     Pelvic:  Cervical exam performed Dilation: Closed      Extremities: Normal range of motion.  Edema: None  Mental Status: Normal mood and affect. Normal behavior. Normal judgment and thought content.   Assessment   18 y.o.  G1P0000 at [redacted]w[redacted]d by  08/06/2019, by Ultrasound presenting for routine prenatal visit  Plan   pregnancy Problems (from 01/16/19 to present)    Problem Noted Resolved   Chlamydia infection affecting pregnancy 03/30/2019 by Will Bonnet, MD No   Overview Signed 03/30/2019  9:14 AM by Will Bonnet, MD    - positive 01/2019 - positive TOC 02/2019 [ ]  TOC 03/2019      Supervision of normal first pregnancy, antepartum 01/16/2019 by Rod Can, CNM No   Overview Addendum 04/27/2019  4:44 PM by Rexene Agent, [place] Prenatal Labs  Dating Ultrasound 12w 2d Blood type: O/Positive/-- (02/19 1158)   Genetic Screen NIPS: Negative XY Antibody:Negative (02/19 1158)  Anatomic [place] Complete/normal 03/30/2019 Rubella: 9.26 (02/19 1158) Varicella: Immune  GTT Early:               Third trimester:  RPR: Non Reactive (02/19 1158)   Rhogam N/A HBsAg: Negative (02/19 1158)   TDaP vaccine   Flu Shot: Nov 2019 HIV: Non Reactive (02/19 1158)   Baby Food                                GBS:   Contraception  Pap: N/A < 21  CBB     CS/VBAC NA   Support Person Lisa Solis              Preterm labor symptoms and general obstetric precautions including but not limited to vaginal bleeding, contractions, leaking of fluid and fetal movement were reviewed in detail with the patient. Please refer to After Visit Summary for other counseling recommendations.   Return in about 1 week (around 07/20/2019) for rob.  Rod Can, CNM 07/13/2019 2:33 PM

## 2019-07-13 NOTE — Patient Instructions (Signed)
Braxton Hicks Contractions Contractions of the uterus can occur throughout pregnancy, but they are not always a sign that you are in labor. You may have practice contractions called Braxton Hicks contractions. These false labor contractions are sometimes confused with true labor. What are Braxton Hicks contractions? Braxton Hicks contractions are tightening movements that occur in the muscles of the uterus before labor. Unlike true labor contractions, these contractions do not result in opening (dilation) and thinning of the cervix. Toward the end of pregnancy (32-34 weeks), Braxton Hicks contractions can happen more often and may become stronger. These contractions are sometimes difficult to tell apart from true labor because they can be very uncomfortable. You should not feel embarrassed if you go to the hospital with false labor. Sometimes, the only way to tell if you are in true labor is for your health care provider to look for changes in the cervix. The health care provider will do a physical exam and may monitor your contractions. If you are not in true labor, the exam should show that your cervix is not dilating and your water has not broken. If there are no other health problems associated with your pregnancy, it is completely safe for you to be sent home with false labor. You may continue to have Braxton Hicks contractions until you go into true labor. How to tell the difference between true labor and false labor True labor  Contractions last 30-70 seconds.  Contractions become very regular.  Discomfort is usually felt in the top of the uterus, and it spreads to the lower abdomen and low back.  Contractions do not go away with walking.  Contractions usually become more intense and increase in frequency.  The cervix dilates and gets thinner. False labor  Contractions are usually shorter and not as strong as true labor contractions.  Contractions are usually irregular.  Contractions  are often felt in the front of the lower abdomen and in the groin.  Contractions may go away when you walk around or change positions while lying down.  Contractions get weaker and are shorter-lasting as time goes on.  The cervix usually does not dilate or become thin. Follow these instructions at home:   Take over-the-counter and prescription medicines only as told by your health care provider.  Keep up with your usual exercises and follow other instructions from your health care provider.  Eat and drink lightly if you think you are going into labor.  If Braxton Hicks contractions are making you uncomfortable: ? Change your position from lying down or resting to walking, or change from walking to resting. ? Sit and rest in a tub of warm water. ? Drink enough fluid to keep your urine pale yellow. Dehydration may cause these contractions. ? Do slow and deep breathing several times an hour.  Keep all follow-up prenatal visits as told by your health care provider. This is important. Contact a health care provider if:  You have a fever.  You have continuous pain in your abdomen. Get help right away if:  Your contractions become stronger, more regular, and closer together.  You have fluid leaking or gushing from your vagina.  You pass blood-tinged mucus (bloody show).  You have bleeding from your vagina.  You have low back pain that you never had before.  You feel your baby's head pushing down and causing pelvic pressure.  Your baby is not moving inside you as much as it used to. Summary  Contractions that occur before labor are   called Braxton Hicks contractions, false labor, or practice contractions.  Braxton Hicks contractions are usually shorter, weaker, farther apart, and less regular than true labor contractions. True labor contractions usually become progressively stronger and regular, and they become more frequent.  Manage discomfort from Braxton Hicks contractions  by changing position, resting in a warm bath, drinking plenty of water, or practicing deep breathing. This information is not intended to replace advice given to you by your health care provider. Make sure you discuss any questions you have with your health care provider. Document Released: 04/07/2017 Document Revised: 11/04/2017 Document Reviewed: 04/07/2017 Elsevier Patient Education  2020 [employer]  

## 2019-07-13 NOTE — Progress Notes (Signed)
ROB/GBS- no concerns 

## 2019-07-15 LAB — STREP GP B NAA: Strep Gp B NAA: NEGATIVE

## 2019-07-17 LAB — CERVICOVAGINAL ANCILLARY ONLY
Chlamydia: POSITIVE — AB
Neisseria Gonorrhea: NEGATIVE
Trichomonas: NEGATIVE

## 2019-07-23 ENCOUNTER — Other Ambulatory Visit: Payer: Self-pay | Admitting: Advanced Practice Midwife

## 2019-07-23 ENCOUNTER — Encounter: Payer: Self-pay | Admitting: Obstetrics and Gynecology

## 2019-07-23 ENCOUNTER — Ambulatory Visit (INDEPENDENT_AMBULATORY_CARE_PROVIDER_SITE_OTHER): Payer: Managed Care, Other (non HMO) | Admitting: Obstetrics and Gynecology

## 2019-07-23 ENCOUNTER — Other Ambulatory Visit: Payer: Self-pay

## 2019-07-23 VITALS — BP 110/60 | Wt 121.0 lb

## 2019-07-23 DIAGNOSIS — Z3400 Encounter for supervision of normal first pregnancy, unspecified trimester: Secondary | ICD-10-CM

## 2019-07-23 DIAGNOSIS — O98813 Other maternal infectious and parasitic diseases complicating pregnancy, third trimester: Secondary | ICD-10-CM

## 2019-07-23 DIAGNOSIS — A749 Chlamydial infection, unspecified: Secondary | ICD-10-CM

## 2019-07-23 DIAGNOSIS — O2613 Low weight gain in pregnancy, third trimester: Secondary | ICD-10-CM

## 2019-07-23 DIAGNOSIS — Z3A38 38 weeks gestation of pregnancy: Secondary | ICD-10-CM

## 2019-07-23 MED ORDER — AZITHROMYCIN 500 MG PO TABS: 1000.0000 mg | ORAL_TABLET | Freq: Once | ORAL | 0 refills | Status: AC

## 2019-07-23 NOTE — Progress Notes (Signed)
Routine Prenatal Care Visit  Subjective  Lisa Solis is a 18 y.o. G1P0000 at [redacted]w[redacted]d being seen today for ongoing prenatal care.  She is currently monitored for the following issues for this low-risk pregnancy and has Supervision of normal first pregnancy, antepartum; Chlamydia infection affecting pregnancy; and Poor weight gain of pregnancy, third trimester on their problem list.  ----------------------------------------------------------------------------------- Patient reports no complaints.   Contractions: Not present. Vag. Bleeding: None.  Movement: Present. Denies leaking of fluid.  ----------------------------------------------------------------------------------- The following portions of the patient's history were reviewed and updated as appropriate: allergies, current medications, past family history, past medical history, past social history, past surgical history and problem list. Problem list updated.   Objective  Blood pressure 110/60, weight 121 lb (54.9 kg), last menstrual period 10/22/2018. Pregravid weight 108 lb (49 kg) Total Weight Gain 13 lb (5.897 kg) Urinalysis: Urine Protein    Urine Glucose    Fetal Status: Fetal Heart Rate (bpm): 120 Fundal Height: 36 cm Movement: Present     General:  Alert, oriented and cooperative. Patient is in no acute distress.  Skin: Skin is warm and dry. No rash noted.   Cardiovascular: Normal heart rate noted  Respiratory: Normal respiratory effort, no problems with respiration noted  Abdomen: Soft, gravid, appropriate for gestational age. Pain/Pressure: Present     Pelvic:  Cervical exam deferred        Extremities: Normal range of motion.  Edema: None  Mental Status: Normal mood and affect. Normal behavior. Normal judgment and thought content.   Assessment   18 y.o. G1P0000 at [redacted]w[redacted]d by  08/06/2019, by Ultrasound presenting for routine prenatal visit  Plan   pregnancy Problems (from 01/16/19 to present)    Problem Noted  Resolved   Chlamydia infection affecting pregnancy 03/30/2019 by Will Bonnet, MD No   Overview Signed 03/30/2019  9:14 AM by Will Bonnet, MD    - positive 01/2019 - positive TOC 02/2019 [ ]  TOC 03/2019      Supervision of normal first pregnancy, antepartum 01/16/2019 by Rod Can, CNM No   Overview Addendum 04/27/2019  4:44 PM by Rexene Agent, [place] Prenatal Labs  Dating Ultrasound 12w 2d Blood type: O/Positive/-- (02/19 1158)   Genetic Screen NIPS: Negative XY Antibody:Negative (02/19 1158)  Anatomic [place] Complete/normal 03/30/2019 Rubella: 9.26 (02/19 1158) Varicella: Immune  GTT Early:               Third trimester:  RPR: Non Reactive (02/19 1158)   Rhogam N/A HBsAg: Negative (02/19 1158)   TDaP vaccine   Flu Shot: Nov 2019 HIV: Non Reactive (02/19 1158)   Baby Food                                GBS:   Contraception  Pap: N/A < 21  CBB     CS/VBAC NA   Support Person Legrand Como              Term labor symptoms and general obstetric precautions including but not limited to vaginal bleeding, contractions, leaking of fluid and fetal movement were reviewed in detail with the patient. Please refer to After Visit Summary for other counseling recommendations.   - Tx'd for chlamydia today (just found out about results)  Return in about 1 week (around 07/30/2019) for Routine Prenatal Appointment.  Prentice Docker, MD, [facility], [facility] 07/23/2019 4:53  PM

## 2019-07-23 NOTE — Progress Notes (Unsigned)
Rx azithromycin sent to pharmacy to treat Chlamydia infection.

## 2019-07-30 ENCOUNTER — Encounter: Payer: Self-pay | Admitting: Obstetrics & Gynecology

## 2019-07-30 ENCOUNTER — Other Ambulatory Visit: Payer: Self-pay

## 2019-07-30 ENCOUNTER — Ambulatory Visit (INDEPENDENT_AMBULATORY_CARE_PROVIDER_SITE_OTHER): Payer: Managed Care, Other (non HMO) | Admitting: Obstetrics & Gynecology

## 2019-07-30 VITALS — BP 100/60 | Wt 120.0 lb

## 2019-07-30 DIAGNOSIS — Z3A39 39 weeks gestation of pregnancy: Secondary | ICD-10-CM

## 2019-07-30 DIAGNOSIS — Z23 Encounter for immunization: Secondary | ICD-10-CM | POA: Diagnosis not present

## 2019-07-30 DIAGNOSIS — Z3400 Encounter for supervision of normal first pregnancy, unspecified trimester: Secondary | ICD-10-CM

## 2019-07-30 DIAGNOSIS — Z3403 Encounter for supervision of normal first pregnancy, third trimester: Secondary | ICD-10-CM

## 2019-07-30 NOTE — Progress Notes (Signed)
  Subjective  Fetal Movement? yes Contractions? no Leaking Fluid? no Vaginal Bleeding? no  Objective  BP 100/60   Wt 120 lb (54.4 kg)   LMP 10/22/2018 (Approximate)   BMI 24.24 kg/m  General: NAD Pumonary: no increased work of breathing Abdomen: gravid, non-tender Extremities: no edema Psychiatric: mood appropriate, affect full Cx 0/70/-3 Assessment  18 y.o. G1P0000 at [redacted]w[redacted]d by  08/06/2019, by Ultrasound presenting for routine prenatal visit  Plan   Problem List Items Addressed This Visit      Other   Supervision of normal first pregnancy, antepartum    Other Visit Diagnoses    [redacted] weeks gestation of pregnancy    -  Primary      pregnancy Problems (from 01/16/19 to present)    Problem Noted Resolved   Chlamydia infection affecting pregnancy  No   Overview Signed 03/30/2019  9:14 AM by Will Bonnet, MD    Treated several times in pregnancy     Supervision of normal first pregnancy, antepartum  No   Overview Addendum 07/30/2019 11:21 AM by Gae Dry, MD    Clinic [facility] Prenatal Labs  Dating Ultrasound 12w 2d Blood type: O/Positive/-- (02/19 1158)   Genetic Screen NIPS: Negative XY Antibody:Negative (02/19 1158)  Anatomic [place] Complete/normal 03/30/2019 Rubella: 9.26 (02/19 1158) Varicella: Immune  GTT Third trimester: normal  RPR: Non Reactive (06/12 1107)   Rhogam N/A HBsAg: Negative (02/19 1158)   TDaP vaccine  05/31/19 Flu Shot: Nov 2019, Aug 2020 HIV: Non Reactive (06/12 1107)   Baby Food                  Bottle              [id number] (08/07 1631)  Contraception      Nexplanon Pap: N/A < 21  CBB  No   CS/VBAC NA   Support Person Michael            Bottle feeding plans discussed  Plan Nexplanon for [facility], all options discussed today  Flu shot today  Barnett Applebaum, MD, [facility], [facility] 07/30/2019  11:31 AM

## 2019-08-06 ENCOUNTER — Inpatient Hospital Stay
Admission: EM | Admit: 2019-08-06 | Discharge: 2019-08-08 | DRG: 787 | Disposition: A | Discharge status: Other Acute Inpatient Hospital | Payer: Managed Care, Other (non HMO) | Attending: Obstetrics and Gynecology | Admitting: Obstetrics and Gynecology

## 2019-08-06 ENCOUNTER — Other Ambulatory Visit: Payer: Self-pay | Admitting: Obstetrics and Gynecology

## 2019-08-06 ENCOUNTER — Other Ambulatory Visit: Payer: Self-pay

## 2019-08-06 ENCOUNTER — Encounter: Payer: Self-pay | Admitting: Obstetrics and Gynecology

## 2019-08-06 ENCOUNTER — Ambulatory Visit (INDEPENDENT_AMBULATORY_CARE_PROVIDER_SITE_OTHER): Payer: Managed Care, Other (non HMO) | Admitting: Obstetrics and Gynecology

## 2019-08-06 VITALS — BP 104/70 | Wt 117.0 lb

## 2019-08-06 DIAGNOSIS — Z87891 Personal history of nicotine dependence: Secondary | ICD-10-CM | POA: Diagnosis not present

## 2019-08-06 DIAGNOSIS — O480 Post-term pregnancy: Secondary | ICD-10-CM

## 2019-08-06 DIAGNOSIS — Z20828 Contact with and (suspected) exposure to other viral communicable diseases: Secondary | ICD-10-CM | POA: Diagnosis present

## 2019-08-06 DIAGNOSIS — Z3A40 40 weeks gestation of pregnancy: Secondary | ICD-10-CM

## 2019-08-06 DIAGNOSIS — O4292 Full-term premature rupture of membranes, unspecified as to length of time between rupture and onset of labor: Secondary | ICD-10-CM | POA: Diagnosis present

## 2019-08-06 DIAGNOSIS — Z3A00 Weeks of gestation of pregnancy not specified: Secondary | ICD-10-CM | POA: Diagnosis not present

## 2019-08-06 DIAGNOSIS — O2690 Pregnancy related conditions, unspecified, unspecified trimester: Secondary | ICD-10-CM | POA: Diagnosis present

## 2019-08-06 DIAGNOSIS — O9832 Other infections with a predominantly sexual mode of transmission complicating childbirth: Secondary | ICD-10-CM | POA: Diagnosis present

## 2019-08-06 DIAGNOSIS — Z98891 History of uterine scar from previous surgery: Secondary | ICD-10-CM

## 2019-08-06 DIAGNOSIS — Z113 Encounter for screening for infections with a predominantly sexual mode of transmission: Secondary | ICD-10-CM

## 2019-08-06 DIAGNOSIS — D649 Anemia, unspecified: Secondary | ICD-10-CM | POA: Diagnosis present

## 2019-08-06 DIAGNOSIS — Z3400 Encounter for supervision of normal first pregnancy, unspecified trimester: Secondary | ICD-10-CM

## 2019-08-06 DIAGNOSIS — O9902 Anemia complicating childbirth: Secondary | ICD-10-CM | POA: Diagnosis present

## 2019-08-06 DIAGNOSIS — O4290 Premature rupture of membranes, unspecified as to length of time between rupture and onset of labor, unspecified weeks of gestation: Secondary | ICD-10-CM | POA: Diagnosis present

## 2019-08-06 DIAGNOSIS — A5609 Other chlamydial infection of lower genitourinary tract: Secondary | ICD-10-CM | POA: Diagnosis present

## 2019-08-06 DIAGNOSIS — O26893 Other specified pregnancy related conditions, third trimester: Secondary | ICD-10-CM | POA: Diagnosis present

## 2019-08-06 DIAGNOSIS — O4202 Full-term premature rupture of membranes, onset of labor within 24 hours of rupture: Secondary | ICD-10-CM | POA: Diagnosis not present

## 2019-08-06 HISTORY — DX: Cardiac murmur, unspecified: R01.1

## 2019-08-06 LAB — RUPTURE OF MEMBRANE (ROM)PLUS: Rom Plus: POSITIVE

## 2019-08-06 MED ORDER — ONDANSETRON HCL 4 MG/2ML IJ SOLN: 4.0000 mg | Freq: Four times a day (QID) | INTRAMUSCULAR | Status: DC | PRN

## 2019-08-06 MED ORDER — BUTORPHANOL TARTRATE 1 MG/ML IJ SOLN: 1.0000 mg | INTRAMUSCULAR | Status: DC | PRN

## 2019-08-06 MED ORDER — MISOPROSTOL 25 MCG QUARTER TABLET
25.0000 ug | ORAL_TABLET | ORAL | Status: DC | PRN
  Administered 2019-08-07 (×2): 25 ug via VAGINAL
  Filled 2019-08-06 (×2): qty 1

## 2019-08-06 MED ORDER — OXYTOCIN BOLUS FROM INFUSION: 500.0000 mL | Freq: Once | INTRAVENOUS | Status: DC

## 2019-08-06 MED ORDER — LIDOCAINE HCL (PF) 1 % IJ SOLN: 30.0000 mL | INTRAMUSCULAR | Status: DC | PRN

## 2019-08-06 MED ORDER — MISOPROSTOL 25 MCG QUARTER TABLET
25.0000 ug | ORAL_TABLET | Freq: Once | ORAL | Status: AC
  Administered 2019-08-07: 25 ug via BUCCAL
  Filled 2019-08-06: qty 1

## 2019-08-06 MED ORDER — LACTATED RINGERS IV SOLN
INTRAVENOUS | Status: DC
  Administered 2019-08-07: 02:00:00 via INTRAVENOUS

## 2019-08-06 MED ORDER — ACETAMINOPHEN 325 MG PO TABS: 650.0000 mg | ORAL_TABLET | ORAL | Status: DC | PRN

## 2019-08-06 MED ORDER — OXYTOCIN 40 UNITS IN NORMAL SALINE INFUSION - SIMPLE MED
2.5000 [IU]/h | INTRAVENOUS | Status: DC
  Filled 2019-08-06: qty 1000

## 2019-08-06 MED ORDER — LACTATED RINGERS IV SOLN: 500.0000 mL | INTRAVENOUS | Status: DC | PRN

## 2019-08-06 MED ORDER — TERBUTALINE SULFATE 1 MG/ML IJ SOLN
0.2500 mg | Freq: Once | INTRAMUSCULAR | Status: AC | PRN
  Administered 2019-08-07: 20:00:00 0.25 mg via SUBCUTANEOUS
  Filled 2019-08-06: qty 1

## 2019-08-06 NOTE — H&P (Signed)
OB History & Physical   History of Present Illness:  Chief Complaint: water broke  HPI:  Lisa Solis is a 18 y.o. G1P0000 female at 3211w0d dated by ultrasound.  Her pregnancy has been complicated by chlamydia infection, poor weight gain.    She reports mild contractions.   She reports a gush of fluid leaking around 9:00 PM tonight.   She denies vaginal bleeding.   She reports fetal movement.    Total weight gain for pregnancy: 4.082 kg   Obstetrical Problem List: pregnancy Problems (from 01/16/19 to present)    Problem Noted Resolved   Chlamydia infection affecting pregnancy 03/30/2019 by Conard NovakJackson, Stephen D, MD No   Overview Signed 03/30/2019  9:14 AM by Conard NovakJackson, Stephen D, MD    - positive 01/2019 - positive TOC 02/2019 [ ]  TOC 03/2019      Supervision of normal first pregnancy, antepartum 01/16/2019 by Tresea MallGledhill, Kiernan Farkas, CNM No   Overview Addendum 07/30/2019 11:35 AM by Nadara MustardHarris, Robert P, MD    Clinic [facility] Prenatal [facility]  Dating Ultrasound 12w 2d Blood type: O/Positive/-- (02/19 1158)   Genetic Screen NIPS: Negative XY Antibody:Negative (02/19 1158)  Anatomic US Complete/normal 03/30/2019 Rubella: 9.26 (02/19 1158) Varicella: Immune  GTT nml  RPR: Non Reactive (06/12 1107)   Rhogam N/A HBsAg: Negative (02/19 1158)   TDaP vaccine 6/25  Flu Shot: 07/30/19 HIV: Non Reactive (06/12 1107)   Baby Food  Bottle UJW:JXBJYNWGGBS:Negative (08/07 1631)  Contraception  Nexplanon Pap: N/A < 21  CBB  No   CS/VBAC NA   Support Person Michael              Maternal Medical History:   Past Medical History:  Diagnosis Date  . Heart murmur    ages 1212-13  . Ovarian cyst     Past Surgical History:  Procedure Laterality Date  . CYST REMOVAL NECK      No Known Allergies  Prior to Admission medications   Medication Sig Start Date End Date Taking? Authorizing Provider  Prenatal Vit-Fe Fumarate-FA (PRENATAL MULTIVITAMIN) TABS tablet Take 1 tablet by mouth daily at 12 noon.   Yes [provider]    OB History  Gravida Para Term Preterm AB Living  1 0 0 0 0 0  SAB TAB Ectopic Multiple Live Births  0 0 0 0 0    # Outcome Date GA Lbr Len/2nd Weight Sex Delivery Anes PTL Lv  1 Current             Prenatal care site: [facility]  Social History: She  reports that she has quit smoking. Her smoking use included e-cigarettes. She has never used smokeless tobacco. She reports that she does not drink alcohol or use drugs.  Family History: family history includes Hypertension in her mother.    Review of Systems:  Review of Systems  Constitutional: Negative.   HENT: Negative.   Eyes: Negative.   Respiratory: Negative.   Cardiovascular: Negative.   Gastrointestinal: Negative.   Genitourinary: Negative.   Musculoskeletal: Negative.   Skin: Negative.   Neurological: Negative.   Endo/Heme/Allergies: Negative.   Psychiatric/Behavioral: Negative.      Physical Exam:  BP 128/71 (BP Location: Left Arm)   Pulse 77   Temp 98.5 F (36.9 C) (Oral)   Resp 16   Ht 4\' 11"  (1.499 m)   Wt 54.4 kg   LMP 10/22/2018 (Approximate)   BMI 24.24 kg/m   Constitutional: Well nourished, well developed female in  no acute distress.  HEENT: normal Skin: Warm and dry.  Cardiovascular: Regular rate and rhythm.   Extremity: no edema  Respiratory: Clear to auscultation bilateral. Normal respiratory effort Abdomen: FHT present Back: no CVAT Neuro: DTRs 2+, Cranial nerves grossly intact Psych: Alert and Oriented x3. No memory deficits. Normal mood and affect.  MS: normal gait, normal bilateral lower extremity ROM/strength/stability.  Pelvic exam: per RN S. Minette Brine 1/50/-3   Pertinent Results:  Prenatal Labs Blood type/Rh O positive  Antibody screen negative  Rubella Immune  Varicella Immune    RPR Non-reactive  HBsAg negative  HIV negative  GC negative  Chlamydia negative  Genetic screening Negative/female  1 hour GTT 74  3 hour GTT NA  GBS negative on 8/7   Results for  Solis, Legna J (MRN [id number]) as of 08/07/2019 00:04  Ref. Range 08/06/2019 22:09  Rom Plus Unknown POSITIVE    Baseline FHR: 120 beats/min   Variability: moderate   Accelerations: present   Decelerations: absent Contractions: present frequency: every 2-7 Overall assessment: reassuring   No results found for: SARSCOV2NAA] pending  Assessment:  Lisa Solis is a 18 y.o. G68P0000 female at [redacted]w[redacted]d with premature rupture of membranes.   Plan:  1. Admit to Labor & Delivery  2. CBC, T&S, Clrs, IVF 3. GBS negative.   4. Fetal well-being: Category I 5. Begin induction with misoprostol 25 mcg cervical/25 mcg buccal   Rod Can, CNM 08/06/2019 11:54 PM

## 2019-08-06 NOTE — Progress Notes (Signed)
    Routine Prenatal Care Visit  Subjective  Lisa Solis is a 18 y.o. G1P0000 at [redacted]w[redacted]d being seen today for ongoing prenatal care.  She is currently monitored for the following issues for this low-risk pregnancy and has Supervision of normal first pregnancy, antepartum; Chlamydia infection affecting pregnancy; and Poor weight gain of pregnancy, third trimester on their problem list.  ----------------------------------------------------------------------------------- Patient reports general discomfort.   Contractions: Not present. Vag. Bleeding: None.  Movement: Present. Denies leaking of fluid.  ----------------------------------------------------------------------------------- The following portions of the patient's history were reviewed and updated as appropriate: allergies, current medications, past family history, past medical history, past social history, past surgical history and problem list. Problem list updated.   Objective  Blood pressure 104/70, weight 117 lb (53.1 kg), last menstrual period 10/22/2018. Pregravid weight 108 lb (49 kg) Total Weight Gain 9 lb (4.082 kg) Urinalysis:      Fetal Status: Fetal Heart Rate (bpm): 120 Fundal Height: 38 cm Movement: Present  Presentation: Vertex  General:  Alert, oriented and cooperative. Patient is in no acute distress.  Skin: Skin is warm and dry. No rash noted.   Cardiovascular: Normal heart rate noted  Respiratory: Normal respiratory effort, no problems with respiration noted  Abdomen: Soft, gravid, appropriate for gestational age. Pain/Pressure: Present     Pelvic:  Cervical exam performed Dilation: 1 Effacement (%): 50 Station: -3  Extremities: Normal range of motion.  Edema: None  Mental Status: Normal mood and affect. Normal behavior. Normal judgment and thought content.     Assessment   18 y.o. G1P0000 at [redacted]w[redacted]d by  08/06/2019, by Ultrasound presenting for routine prenatal visit  Plan   pregnancy Problems (from  01/16/19 to present)    Problem Noted Resolved   Chlamydia infection affecting pregnancy 03/30/2019 by Will Bonnet, MD No   Overview Signed 03/30/2019  9:14 AM by Will Bonnet, MD    - positive 01/2019 - positive TOC 02/2019 [ ]  TOC 03/2019      Supervision of normal first pregnancy, antepartum 01/16/2019 by Rod Can, CNM No   Overview Addendum 07/30/2019 11:35 AM by Gae Dry, MD    Clinic [facility] Prenatal [facility]  Dating Ultrasound 12w 2d Blood type: O/Positive/-- (02/19 1158)   Genetic Screen NIPS: Negative XY Antibody:Negative (02/19 1158)  Anatomic [place] Complete/normal 03/30/2019 Rubella: 9.26 (02/19 1158) Varicella: Immune  GTT nml  RPR: Non Reactive (06/12 1107)   Rhogam N/A HBsAg: Negative (02/19 1158)   TDaP vaccine 6/25  Flu Shot: 07/30/19 HIV: Non Reactive (06/12 1107)   Baby Food  Bottle KPT:WSFKCLEX (08/07 1631)  Contraception  Nexplanon Pap: N/A < 21  CBB  No   CS/VBAC NA   Support Person Legrand Como              Gestational age appropriate obstetric precautions including but not limited to vaginal bleeding, contractions, leaking of fluid and fetal movement were reviewed in detail with the patient.    Declines membrane sweep Small BRB on glove with exam.  Retested on urine cytology for GC/CT.  IOL scheduled for elective induction on 07/09/2019 at 8am. Discussed Covid testing with patient. Orders placed.   Return in about 1 week (around 08/13/2019) for rob.  Homero Fellers MD [facility], [facility] 08/06/2019, 12:28 PM

## 2019-08-06 NOTE — OB Triage Note (Signed)
Pt is a 18 y/o at 94 w0d with c/o SROM at 2100, clear fluid large amount. Pt states +FM. Pt denies CTX and VB. Pt states intermittent pressure in her lower abdomin. Monitors applied and assessing. Initial FHT 115.

## 2019-08-06 NOTE — Progress Notes (Signed)
ROB No concerns  Talk about membrane sweep

## 2019-08-07 ENCOUNTER — Inpatient Hospital Stay: Payer: Managed Care, Other (non HMO) | Admitting: Registered Nurse

## 2019-08-07 ENCOUNTER — Encounter
Admission: EM | Disposition: A | Discharge status: Other Acute Inpatient Hospital | Payer: Self-pay | Source: Home / Self Care | Attending: Obstetrics and Gynecology

## 2019-08-07 ENCOUNTER — Encounter: Payer: Self-pay | Admitting: *Deleted

## 2019-08-07 DIAGNOSIS — Z3A40 40 weeks gestation of pregnancy: Secondary | ICD-10-CM

## 2019-08-07 LAB — TYPE AND SCREEN
ABO/RH(D): O POS
Antibody Screen: NEGATIVE

## 2019-08-07 LAB — CBC
HCT: 36 % (ref 36.0–46.0)
Hemoglobin: 11.6 g/dL — ABNORMAL LOW (ref 12.0–15.0)
MCH: 26 pg (ref 26.0–34.0)
MCHC: 32.2 g/dL (ref 30.0–36.0)
MCV: 80.7 fL (ref 80.0–100.0)
Platelets: 245 10*3/uL (ref 150–400)
RBC: 4.46 MIL/uL (ref 3.87–5.11)
RDW: 14.7 % (ref 11.5–15.5)
WBC: 8.3 10*3/uL (ref 4.0–10.5)
nRBC: 0 % (ref 0.0–0.2)

## 2019-08-07 LAB — CHLAMYDIA/NGC RT PCR (ARMC ONLY)
Chlamydia Tr: DETECTED — AB
N gonorrhoeae: NOT DETECTED

## 2019-08-07 LAB — SARS CORONAVIRUS 2 BY RT PCR (HOSPITAL ORDER, PERFORMED IN ~~LOC~~ HOSPITAL LAB): SARS Coronavirus 2: NEGATIVE

## 2019-08-07 LAB — RPR: RPR Ser Ql: NONREACTIVE

## 2019-08-07 SURGERY — Surgical Case
Anesthesia: Epidural | Site: Abdomen

## 2019-08-07 MED ORDER — AMMONIA AROMATIC IN INHA
RESPIRATORY_TRACT | Status: AC
  Filled 2019-08-07: qty 10

## 2019-08-07 MED ORDER — KETOROLAC TROMETHAMINE 30 MG/ML IJ SOLN: 30.0000 mg | Freq: Four times a day (QID) | INTRAMUSCULAR | Status: DC | PRN

## 2019-08-07 MED ORDER — ONDANSETRON HCL 4 MG/2ML IJ SOLN
INTRAMUSCULAR | Status: DC | PRN
  Administered 2019-08-07: 4 mg via INTRAVENOUS

## 2019-08-07 MED ORDER — OXYTOCIN 40 UNITS IN NORMAL SALINE INFUSION - SIMPLE MED
1.0000 m[IU]/min | INTRAVENOUS | Status: DC
  Administered 2019-08-07: 12:00:00 2 m[IU]/min via INTRAVENOUS
  Administered 2019-08-07: 21:00:00 200 mL via INTRAVENOUS
  Administered 2019-08-07: 20:00:00 500 mL via INTRAVENOUS
  Filled 2019-08-07: qty 1000

## 2019-08-07 MED ORDER — CARBOPROST TROMETHAMINE 250 MCG/ML IM SOLN
INTRAMUSCULAR | Status: AC
  Filled 2019-08-07: qty 1

## 2019-08-07 MED ORDER — DIPHENHYDRAMINE HCL 25 MG PO CAPS: 25.0000 mg | ORAL_CAPSULE | ORAL | Status: DC | PRN

## 2019-08-07 MED ORDER — DIPHENHYDRAMINE HCL 50 MG/ML IJ SOLN: 12.5000 mg | INTRAMUSCULAR | Status: DC | PRN

## 2019-08-07 MED ORDER — DIBUCAINE (PERIANAL) 1 % EX OINT: 1.0000 "application " | TOPICAL_OINTMENT | CUTANEOUS | Status: DC | PRN

## 2019-08-07 MED ORDER — BUPIVACAINE HCL (PF) 0.5 % IJ SOLN
INTRAMUSCULAR | Status: AC
  Filled 2019-08-07: qty 30

## 2019-08-07 MED ORDER — BUPIVACAINE IN DEXTROSE 0.75-8.25 % IT SOLN
INTRATHECAL | Status: DC | PRN
  Administered 2019-08-07: 1.3 mL via INTRATHECAL

## 2019-08-07 MED ORDER — AZITHROMYCIN 500 MG PO TABS
1000.0000 mg | ORAL_TABLET | Freq: Once | ORAL | Status: AC
  Administered 2019-08-07: 1000 mg via ORAL
  Filled 2019-08-07: qty 2

## 2019-08-07 MED ORDER — SODIUM CHLORIDE 0.9 % IV SOLN
500.0000 mg | INTRAVENOUS | Status: AC
  Administered 2019-08-07: 500 mg via INTRAVENOUS
  Filled 2019-08-07: qty 500

## 2019-08-07 MED ORDER — KETOROLAC TROMETHAMINE 30 MG/ML IJ SOLN
30.0000 mg | Freq: Four times a day (QID) | INTRAMUSCULAR | Status: DC | PRN
  Administered 2019-08-07: 30 mg via INTRAVENOUS
  Filled 2019-08-07: qty 1

## 2019-08-07 MED ORDER — NALOXONE HCL 4 MG/10ML IJ SOLN
1.0000 ug/kg/h | INTRAVENOUS | Status: DC | PRN
  Filled 2019-08-07: qty 5

## 2019-08-07 MED ORDER — ACETAMINOPHEN 500 MG PO TABS: 1000.0000 mg | ORAL_TABLET | Freq: Four times a day (QID) | ORAL | Status: DC

## 2019-08-07 MED ORDER — OXYTOCIN BOLUS FROM INFUSION: 500.0000 mL | Freq: Once | INTRAVENOUS | Status: DC

## 2019-08-07 MED ORDER — LACTATED RINGERS IV SOLN
INTRAVENOUS | Status: DC
  Administered 2019-08-07 (×3): via INTRAVENOUS

## 2019-08-07 MED ORDER — DIPHENHYDRAMINE HCL 25 MG PO CAPS: 25.0000 mg | ORAL_CAPSULE | Freq: Four times a day (QID) | ORAL | Status: DC | PRN

## 2019-08-07 MED ORDER — PRENATAL MULTIVITAMIN CH: 1.0000 | ORAL_TABLET | Freq: Every day | ORAL | Status: DC

## 2019-08-07 MED ORDER — MORPHINE SULFATE (PF) 0.5 MG/ML IJ SOLN
INTRAMUSCULAR | Status: AC
  Filled 2019-08-07: qty 10

## 2019-08-07 MED ORDER — SODIUM CHLORIDE 0.9% FLUSH: 3.0000 mL | INTRAVENOUS | Status: DC | PRN

## 2019-08-07 MED ORDER — FENTANYL 2.5 MCG/ML W/ROPIVACAINE 0.15% IN NS 100 ML EPIDURAL (ARMC)
EPIDURAL | Status: AC
  Filled 2019-08-07: qty 100

## 2019-08-07 MED ORDER — BUPIVACAINE HCL (PF) 0.25 % IJ SOLN
INTRAMUSCULAR | Status: DC | PRN
  Administered 2019-08-07 (×4): 4 mL via EPIDURAL

## 2019-08-07 MED ORDER — OXYCODONE-ACETAMINOPHEN 5-325 MG PO TABS: 1.0000 | ORAL_TABLET | ORAL | Status: DC | PRN

## 2019-08-07 MED ORDER — SODIUM CHLORIDE 0.9 % IV SOLN
8.0000 mg | Freq: Four times a day (QID) | INTRAVENOUS | Status: DC
  Filled 2019-08-07 (×3): qty 4

## 2019-08-07 MED ORDER — OXYCODONE-ACETAMINOPHEN 5-325 MG PO TABS: 2.0000 | ORAL_TABLET | ORAL | Status: DC | PRN

## 2019-08-07 MED ORDER — OXYTOCIN 40 UNITS IN NORMAL SALINE INFUSION - SIMPLE MED: 2.5000 [IU]/h | INTRAVENOUS | Status: DC

## 2019-08-07 MED ORDER — WITCH HAZEL-GLYCERIN EX PADS: 1.0000 "application " | MEDICATED_PAD | CUTANEOUS | Status: DC | PRN

## 2019-08-07 MED ORDER — OXYTOCIN 40 UNITS IN NORMAL SALINE INFUSION - SIMPLE MED
2.5000 [IU]/h | INTRAVENOUS | Status: DC
  Administered 2019-08-07: 22:00:00 2.5 [IU]/h via INTRAVENOUS

## 2019-08-07 MED ORDER — BUPIVACAINE HCL 0.5 % IJ SOLN
INTRAMUSCULAR | Status: DC | PRN
  Administered 2019-08-07: 10 mL

## 2019-08-07 MED ORDER — LACTATED RINGERS IV SOLN: 500.0000 mL | INTRAVENOUS | Status: DC | PRN

## 2019-08-07 MED ORDER — ONDANSETRON HCL 4 MG/2ML IJ SOLN
4.0000 mg | Freq: Four times a day (QID) | INTRAMUSCULAR | Status: DC | PRN
  Administered 2019-08-07: 4 mg via INTRAVENOUS
  Filled 2019-08-07: qty 2

## 2019-08-07 MED ORDER — MENTHOL 3 MG MT LOZG
1.0000 | LOZENGE | OROMUCOSAL | Status: DC | PRN
  Filled 2019-08-07: qty 9

## 2019-08-07 MED ORDER — LACTATED RINGERS IV SOLN
INTRAVENOUS | Status: DC | PRN
  Administered 2019-08-07: 20:00:00 via INTRAVENOUS

## 2019-08-07 MED ORDER — LIDOCAINE HCL (PF) 1 % IJ SOLN: 30.0000 mL | INTRAMUSCULAR | Status: DC | PRN

## 2019-08-07 MED ORDER — BUPIVACAINE 0.25 % ON-Q PUMP DUAL CATH 400 ML
400.0000 mL | INJECTION | Status: DC
  Filled 2019-08-07: qty 400

## 2019-08-07 MED ORDER — IBUPROFEN 600 MG PO TABS: 600.0000 mg | ORAL_TABLET | Freq: Four times a day (QID) | ORAL | Status: DC

## 2019-08-07 MED ORDER — OXYTOCIN 10 UNIT/ML IJ SOLN
INTRAMUSCULAR | Status: AC
  Filled 2019-08-07: qty 2

## 2019-08-07 MED ORDER — MORPHINE SULFATE (PF) 0.5 MG/ML IJ SOLN
INTRAMUSCULAR | Status: DC | PRN
  Administered 2019-08-07: .2 mg via EPIDURAL

## 2019-08-07 MED ORDER — LIDOCAINE HCL (PF) 1 % IJ SOLN
INTRAMUSCULAR | Status: AC
  Filled 2019-08-07: qty 30

## 2019-08-07 MED ORDER — FERROUS SULFATE 325 (65 FE) MG PO TABS: 325.0000 mg | ORAL_TABLET | Freq: Two times a day (BID) | ORAL | Status: DC

## 2019-08-07 MED ORDER — LIDOCAINE-EPINEPHRINE (PF) 1.5 %-1:200000 IJ SOLN
INTRAMUSCULAR | Status: DC | PRN
  Administered 2019-08-07: 3 mL via PERINEURAL

## 2019-08-07 MED ORDER — FENTANYL 2.5 MCG/ML W/ROPIVACAINE 0.15% IN NS 100 ML EPIDURAL (ARMC)
EPIDURAL | Status: DC | PRN
  Administered 2019-08-07: 12 mL/h via EPIDURAL

## 2019-08-07 MED ORDER — METHYLERGONOVINE MALEATE 0.2 MG/ML IJ SOLN
INTRAMUSCULAR | Status: AC
  Filled 2019-08-07: qty 1

## 2019-08-07 MED ORDER — SENNOSIDES-DOCUSATE SODIUM 8.6-50 MG PO TABS: 2.0000 | ORAL_TABLET | Freq: Every day | ORAL | Status: DC

## 2019-08-07 MED ORDER — COCONUT OIL OIL: 1.0000 "application " | TOPICAL_OIL | Status: DC | PRN

## 2019-08-07 MED ORDER — SIMETHICONE 80 MG PO CHEW: 80.0000 mg | CHEWABLE_TABLET | Freq: Three times a day (TID) | ORAL | Status: DC

## 2019-08-07 MED ORDER — LIDOCAINE HCL (PF) 1 % IJ SOLN
INTRAMUSCULAR | Status: DC | PRN
  Administered 2019-08-07: 3 mL

## 2019-08-07 MED ORDER — LACTATED RINGERS IV SOLN: INTRAVENOUS | Status: DC

## 2019-08-07 MED ORDER — PHENYLEPHRINE 40 MCG/ML (10ML) SYRINGE FOR IV PUSH (FOR BLOOD PRESSURE SUPPORT)
PREFILLED_SYRINGE | INTRAVENOUS | Status: DC | PRN
  Administered 2019-08-07: 100 ug via INTRAVENOUS

## 2019-08-07 MED ORDER — MISOPROSTOL 200 MCG PO TABS
ORAL_TABLET | ORAL | Status: AC
  Filled 2019-08-07: qty 4

## 2019-08-07 MED ORDER — SOD CITRATE-CITRIC ACID 500-334 MG/5ML PO SOLN
30.0000 mL | ORAL | Status: DC | PRN
  Administered 2019-08-07: 30 mL via ORAL
  Filled 2019-08-07: qty 30

## 2019-08-07 MED ORDER — CEFAZOLIN SODIUM-DEXTROSE 2-4 GM/100ML-% IV SOLN
2.0000 g | INTRAVENOUS | Status: AC
  Administered 2019-08-07 (×2): 2 g via INTRAVENOUS
  Filled 2019-08-07: qty 100

## 2019-08-07 MED ORDER — MISOPROSTOL 200 MCG PO TABS
ORAL_TABLET | ORAL | Status: AC
  Filled 2019-08-07: qty 5

## 2019-08-07 MED ORDER — ONDANSETRON HCL 4 MG/2ML IJ SOLN
4.0000 mg | Freq: Three times a day (TID) | INTRAMUSCULAR | Status: DC | PRN
  Administered 2019-08-07: 4 mg via INTRAVENOUS
  Filled 2019-08-07: qty 2

## 2019-08-07 MED ORDER — NALOXONE HCL 0.4 MG/ML IJ SOLN: 0.4000 mg | INTRAMUSCULAR | Status: DC | PRN

## 2019-08-07 MED ORDER — PHENYLEPHRINE HCL (PRESSORS) 10 MG/ML IV SOLN
INTRAVENOUS | Status: DC | PRN
  Administered 2019-08-07 (×2): 100 ug via INTRAVENOUS

## 2019-08-07 SURGICAL SUPPLY — 35 items
CANISTER SUCT 3000ML PPV (MISCELLANEOUS) ×3 IMPLANT
CATH KIT ON-Q SILVERSOAK 5IN (CATHETERS) ×6 IMPLANT
CLOSURE WOUND 1/2 X4 (GAUZE/BANDAGES/DRESSINGS) ×1
COVER WAND RF STERILE (DRAPES) ×3 IMPLANT
DERMABOND ADVANCED (GAUZE/BANDAGES/DRESSINGS) ×2
DERMABOND ADVANCED .7 DNX12 (GAUZE/BANDAGES/DRESSINGS) ×1 IMPLANT
DRESSING SURGICEL FIBRLLR 1X2 (HEMOSTASIS) ×1 IMPLANT
DRSG OPSITE POSTOP 4X10 (GAUZE/BANDAGES/DRESSINGS) ×3 IMPLANT
DRSG SURGICEL FIBRILLAR 1X2 (HEMOSTASIS) ×3
DRSG TELFA 3X8 NADH (GAUZE/BANDAGES/DRESSINGS) ×3 IMPLANT
ELECT CAUTERY BLADE 6.4 (BLADE) ×3 IMPLANT
ELECT REM PT RETURN 9FT ADLT (ELECTROSURGICAL) ×3
ELECTRODE REM PT RTRN 9FT ADLT (ELECTROSURGICAL) ×1 IMPLANT
GAUZE SPONGE 4X4 12PLY STRL (GAUZE/BANDAGES/DRESSINGS) ×3 IMPLANT
GLOVE BIO SURGEON STRL SZ7 (GLOVE) ×3 IMPLANT
GLOVE INDICATOR 7.5 STRL GRN (GLOVE) ×3 IMPLANT
GOWN STRL REUS W/ TWL LRG LVL3 (GOWN DISPOSABLE) ×3 IMPLANT
GOWN STRL REUS W/TWL LRG LVL3 (GOWN DISPOSABLE) ×6
NS IRRIG 1000ML POUR BTL (IV SOLUTION) ×3 IMPLANT
PACK C SECTION AR (MISCELLANEOUS) ×3 IMPLANT
PAD OB MATERNITY 4.3X12.25 (PERSONAL CARE ITEMS) ×6 IMPLANT
PAD PREP 24X41 OB/GYN DISP (PERSONAL CARE ITEMS) ×3 IMPLANT
PENCIL SMOKE ULTRAEVAC 22 CON (MISCELLANEOUS) ×3 IMPLANT
SPONGE LAP 18X18 RF (DISPOSABLE) ×6 IMPLANT
STRIP CLOSURE SKIN 1/2X4 (GAUZE/BANDAGES/DRESSINGS) ×2 IMPLANT
SUT MNCRL 4-0 (SUTURE) ×2
SUT MNCRL 4-0 27XMFL (SUTURE) ×1
SUT PDS AB 1 TP1 96 (SUTURE) ×3 IMPLANT
SUT PLAIN GUT 0 (SUTURE) IMPLANT
SUT VIC AB 0 CTX 36 (SUTURE) ×12
SUT VIC AB 0 CTX36XBRD ANBCTRL (SUTURE) ×6 IMPLANT
SUT VIC AB 3-0 SH 27 (SUTURE) ×4
SUT VIC AB 3-0 SH 27X BRD (SUTURE) ×2 IMPLANT
SUTURE MNCRL 4-0 27XMF (SUTURE) ×1 IMPLANT
SWABSTK COMLB BENZOIN TINCTURE (MISCELLANEOUS) ×3 IMPLANT

## 2019-08-07 NOTE — OR Nursing (Signed)
Pre procedure not completed d/t stat c/s, call nurse Lattie Haw states that patient was in the room on arrival.

## 2019-08-07 NOTE — Op Note (Signed)
Lisa Solis   08/07/2019   Pre-operative Diagnosis:  1) Fetal Intolerance of Labor 2) intrauterine pregnancy at 1936w1d   Post-operative Diagnosis:  1) Spontaneous uterine rupture 2) Fetal Intolerance of Labor 2) intrauterine pregnancy at 7436w1d   Procedure: Emergent Primary Low Transverse Lisa Section  Surgeon: Surgeon(s) and Role:    Lisa Solis* Lisa Hisaw D, MD - Primary   Anesthesia: spinal   Findings:  1) Upon entering peritoneum on abdominal cavity entry, infant was found outside of the uterus 2) Stellate rupture of uterus in the lower uterine segment extending to cervix   Quantified Blood Loss: 785 mL  Total IV Fluids: 1,300 ml crystalloid  Urine Output: 150 mL  Specimens: none  Complications: Spontaneous uterine rupture immediately pre-operatively  Disposition: PACU - hemodynamically stable.   Maternal Condition: stable   Baby condition / location:  NICU  Indication and Situation: See prior documentation on decision to call emergent Lisa section.  In brief, around 7:18 PM she began having repetitive variable decelerations to the 60s with quick return to baseline of about 160 bpm.  At around 7:36 PM a stat Lisa section was call and the patient was given terbutaline.  Prior to calling the c-section, I checked her cervix and the fetal station was still -2.  Given that she was remote from delivery and primiparous, I decided against operative vaginal delivery.  She had two more contractions and the fetal heart rate stayed around 160 bpm without significant decelerations. She was taken to the OR at about 7:53PM.  Spinal anesthesia was administered and after placing the patient in the supine position on the operating table the fetal heart rate was 132 bpm.  A vaginal prep was performed using betadine and the fetal head was noted to be in the vagina with both sponge swabs performed by me.  Within about 5 minutes the patient was  prepped and draped and incision was made at around 8:20 PM.  The case was not delayed for any OR staff.    Procedure Details:  The patient concurred with the proposed plan, giving informed consent. identified as Lisa Solis [place] [employer] and the procedure verified as C-Section Delivery. A Time Out was held and the above information confirmed.   After induction of anesthesia, the patient was draped and prepped in the usual sterile manner. A Pfannenstiel incision was made and carried down through the subcutaneous tissue to the fascia. Fascial incision was made and extended transversely. The fascia was separated from the underlying rectus tissue superiorly and inferiorly. The peritoneum was identified and entered. Upon entry into the peritoneum, the fetal ear was noted and the rest of the head. The infant was immediately removed from the abdominal cavity with the cord clamped and cut and the infant was handed off the waiting pediatrician. A segment of cord was taken for cord blood gas.    The bladder flap was not bluntly or sharply freed from the lower uterine segment. The infant was in the cephalic presentation within the abdominal cavity. The infant weighed 2,950 gram (6 lb 8 oz).  The Apgar scores were 1 at 1 minute, 2 at 5 minutes and 3 at 10 minutes.  Cord ph was sent (per report the venous pH was 6.87). The umbilical cord was clamped and cord blood was obtained for evaluation. The placenta was removed Intact and appeared normal though it was already detached from the uterus at this point. The bilateral fallopian tubes and ovaries  appeared normal.   A stellate rupture of the lower uterine segment and cervix was repaired in two layers with 0 Vicryl.  The bladder was dissected away to ensure no bladder injury. The first layer was a running, locked suture until only a transverse defect remained. The transverse defect was closed in a running locked layer of 0 Vicryl suture.  A second layer of the same suture was used to  imbricate all defects.  Hemostasis was assured.  The uterus was returned to the abdomen and the paracolic gutters were cleared of all clots and debris.  Fibrillar was placed over the cervix and uterine scar to ensure continued hemostasis.  The rectus muscles were inspected and found to be hemostatic.  The fascia was then reapproximated with running sutures of 1-0 PDS, looped. The fascia was found to be ripped just left of midline. This was carefully repaired using 0 Vicryl until no defect could be found.  This defect was about 1 cm in length.  The subcutaneous tissue was reapproximated using 3-0 Vicryl to reduce tension on the skin closure.  The subcuticular closure was performed using 4-0 monocryl. The skin closure was reinforced using surgical skin glue.  Instrument, sponge, and needle counts were correct prior the abdominal closure and were correct at the conclusion of the case.  The patient received Ancef 2 gram IV and Azithromycin 500 mg IV were given prior to skin incision (within 30 minutes). For VTE prophylaxis she was wearing SCDs throughout the case.    Signed: Will Bonnet, MD 08/07/2019 10:05 PM

## 2019-08-07 NOTE — Anesthesia Procedure Notes (Signed)
Epidural Patient location during procedure: OB Start time: 08/07/2019 11:40 AM End time: 08/07/2019 11:46 AM  Staffing Anesthesiologist: Durenda Hurt, MD Resident/CRNA: Doreen Salvage, CRNA Performed: resident/CRNA   Preanesthetic Checklist Completed: patient identified, site marked, surgical consent, pre-op evaluation, timeout performed, IV checked, risks and benefits discussed and monitors and equipment checked  Epidural Patient position: sitting Prep: ChloraPrep Patient monitoring: heart rate, continuous pulse ox and blood pressure Approach: midline Location: L4-L5 Injection technique: LOR saline  Needle:  Needle type: Tuohy  Needle gauge: 17 G Needle length: 9 cm and 9 Needle insertion depth: 6 cm Catheter type: closed end flexible Catheter size: 19 Gauge Catheter at skin depth: 12 cm Test dose: negative and 1.5% lidocaine with Epi 1:200 K  Assessment Sensory level: T10 Events: blood not aspirated, injection not painful, no injection resistance, negative IV test and no paresthesia  Additional Notes 2 attempt Pt. Evaluated and documentation done after procedure finished. Patient identified. Risks/Benefits/Options discussed with patient including but not limited to bleeding, infection, nerve damage, paralysis, failed block, incomplete pain control, headache, blood pressure changes, nausea, vomiting, reactions to medication both or allergic, itching and postpartum back pain. Confirmed with bedside nurse the patient's most recent platelet count. Confirmed with patient that they are not currently taking any anticoagulation, have any bleeding history or any family history of bleeding disorders. Patient expressed understanding and wished to proceed. All questions were answered. Sterile technique was used throughout the entire procedure. Please see nursing notes for vital signs. Test dose was given through epidural catheter and negative prior to continuing to dose epidural or  start infusion. Warning signs of high block given to the patient including shortness of breath, tingling/numbness in hands, complete motor block, or any concerning symptoms with instructions to call for help. Patient was given instructions on fall risk and not to get out of bed. All questions and concerns addressed with instructions to call with any issues or inadequate analgesia.   Patient tolerated the insertion well without immediate complications.Reason for block:procedure for pain

## 2019-08-07 NOTE — Anesthesia Preprocedure Evaluation (Addendum)
Anesthesia Evaluation  Patient identified by MRN, date of birth, ID band Patient awake    Reviewed: Allergy & Precautions, H&P , NPO status , Patient's Chart, lab work & pertinent test results, reviewed documented beta blocker date and time   History of Anesthesia Complications Negative for: history of anesthetic complications  Airway Mallampati: II  TM Distance: >3 FB Neck ROM: full    Dental no notable dental hx. (+) Chipped   Pulmonary former smoker,    Pulmonary exam normal        Cardiovascular Normal cardiovascular exam+ Valvular Problems/Murmurs   Heart murmur   Neuro/Psych negative neurological ROS  negative psych ROS   GI/Hepatic negative GI ROS, Neg liver ROS,   Endo/Other  negative endocrine ROS  Renal/GU negative Renal ROS  negative genitourinary   Musculoskeletal   Abdominal   Peds  Hematology negative hematology ROS (+)   Anesthesia Other Findings Epidural not working, will proceed with spinal.  Reproductive/Obstetrics (+) Pregnancy                            Anesthesia Physical Anesthesia Plan  ASA: II  Anesthesia Plan: Spinal   Post-op Pain Management:    Induction: Intravenous  PONV Risk Score and Plan:   Airway Management Planned:   Additional Equipment:   Intra-op Plan:   Post-operative Plan:   Informed Consent: I have reviewed the patients History and Physical, chart, labs and discussed the procedure including the risks, benefits and alternatives for the proposed anesthesia with the patient or authorized representative who has indicated his/her understanding and acceptance.       Plan Discussed with: Anesthesiologist  Anesthesia Plan Comments:        Anesthesia Quick Evaluation

## 2019-08-07 NOTE — Discharge Summary (Signed)
OB Discharge Summary     Patient Name: Lisa Solis [place] DOB: 09/09/2001 MRN: [id number]  Date of admission: 08/06/2019 Delivering MD: Thomasene MohairStephen Najib Colmenares, MD  Date of Delivery: 08/07/2019  Date of discharge: 08/08/2019  Admitting diagnosis: Spontaneous rupture of membranes Intrauterine pregnancy: 4876w1d     Secondary diagnosis: chlamydia cervicitis     Discharge diagnosis: Term Pregnancy Delivered, Anemia and uterine rupture, status post cesarean section                                                                                                Post partum procedures:none  Augmentation: Pitocin and Cytotec  Complications: Uterine Rupture  Hospital course:  Onset of Labor With Unplanned C/S  18 y.o. yo G1P1001 at 5576w1d was admitted in Latent Labor on 08/06/2019. She was noted to have chlamydia on admission and was treated.  Patient had a labor course significant for administration of misoprostol to induce labor.  She was converted to pitocin.  She made rapid change once she achieved 2-2.5 cm to 9.5 cm then complete.  She was taken to the OR due to fetal intolerance of labor. Based on best available clinical information, she had a uterine rupture sometime during her time in the operating room (see operative report). Membrane Rupture Time/Date: 8:30 PM ,08/06/2019   The patient went for cesarean section due to fetal intolerance of labor, and delivered a Viable infant,08/07/2019  Details of operation can be found in separate operative note. Patient had an uncomplicated postpartum course.  She is ambulating,tolerating a regular diet, passing flatus, and urinating well.  [facility] infant who was transferred for NICU care in stable condition 08/08/19.  Physical exam  Vitals:   08/08/19 0135 08/08/19 0145 08/08/19 0155 08/08/19 0159  BP:  118/66  114/60  Pulse:  68  61  Resp:  20  20  Temp:  98.5 F (36.9 C)  98.7 F (37.1 C)  TempSrc:  Oral  Oral  SpO2: 97% 99% 97%    Weight:      Height:       General: alert, cooperative and no distress Lochia: appropriate Uterine Fundus: firm Incision: Dressing is clean, dry, and intact DVT Evaluation: No evidence of DVT seen on physical exam. No cords or calf tenderness. No significant calf/ankle edema.  Labs: Lab Results  Component Value Date   WBC 12.9 (H) 08/08/2019   HGB 9.1 (L) 08/08/2019   HCT 27.6 (L) 08/08/2019   MCV 80.5 08/08/2019   PLT 190 08/08/2019    Discharge instruction: per After Visit Summary.  Medications:  Allergies as of 08/08/2019   No Known Allergies     Medication List    ASK your doctor about these medications   prenatal multivitamin Tabs tablet Take 1 tablet by mouth daily at 12 noon.       Diet: routine diet  Activity: Advance as tolerated. Pelvic rest for 6 weeks.   Outpatient follow up: Follow-up Information    Conard NovakJackson, Jacquan Savas D, MD. Schedule an appointment as soon as possible for a visit in 1 week(s).  Specialty: Obstetrics and Gynecology Why: Post-op incision check Contact information: [address] [place] [address] [phone]             Postpartum contraception: Nexplanon Rhogam Given postpartum: no Rubella vaccine given postpartum: no Varicella vaccine given postpartum: no TDaP given antepartum or postpartum: 05/31/2019  Newborn Data: Live born female  Birth Weight: 6 lb 8.1 oz (2950 g) APGAR: 1 (at 1 minute), 2 (at 5 minutes), and 3 (at 10 minutes)  Newborn Delivery   Birth date/time: 08/07/2019 20:21:00 Delivery type: C-Section, Low Transverse C-section categorization: Primary     Infant Disposition:transfer to [facility] NICU for cooling protocol  SIGNED: Prentice Docker, MD, [place], [facility] 08/08/2019 10:29 AM   Prentice Docker, MD 08/08/2019 10:29 AM

## 2019-08-07 NOTE — Transfer of Care (Signed)
Immediate Anesthesia Transfer of Care Note  Patient: Lisa Solis  Procedure(s) Performed: CESAREAN SECTION (N/A Abdomen)  Patient Location: PACU  Anesthesia Type:Spinal  Level of Consciousness: awake, alert  and oriented  Airway & Oxygen Therapy: Patient Spontanous Breathing  Post-op Assessment: Report given to RN and Post -op Vital signs reviewed and stable  Post vital signs: Reviewed and stable  Last Vitals:  Vitals Value Taken Time  BP    Temp    Pulse    Resp    SpO2      Last Pain:  Vitals:   08/07/19 1850  TempSrc: Oral  PainSc:          Complications: No apparent anesthesia complications

## 2019-08-07 NOTE — Anesthesia Post-op Follow-up Note (Signed)
Anesthesia QCDR form completed.        

## 2019-08-07 NOTE — Progress Notes (Signed)
Subjective:  Tearful, coping with contractions.  Objective:   Vitals: Blood pressure 118/78, pulse 62, temperature 97.9 F (36.6 C), temperature source Oral, resp. rate 16, height 4\' 11"  (1.499 m), weight 54.4 kg, last menstrual period 10/22/2018.  General: Breathing through contractions Abdomen: gravid, non-tender Cervical Exam:  Dilation: 2 Effacement (%): 70 Cervical Position: Anterior Station: -2 Presentation: Vertex Exam by:: Patrcia Dolly CNM  FHT: Baseline 125 bpm, moderate variability, accelerations present, declerations absent Toco: Frequency: every 2-3 minutes, duration: 50-80 seconds, intensity: moderate  Results for orders placed or performed during the hospital encounter of 08/06/19 (from the past 24 hour(s))  ROM Plus ([place] only)     Status: None   Collection Time: 08/06/19 10:09 PM  Result Value Ref Range   Rom Plus POSITIVE   Chlamydia/NGC rt PCR ([facility] only)     Status: Abnormal   Collection Time: 08/06/19 10:09 PM   Specimen: Urine  Result Value Ref Range   Specimen source GC/Chlam ENDOCERVICAL    Chlamydia Tr DETECTED (A) NOT DETECTED   N gonorrhoeae NOT DETECTED NOT DETECTED  SARS Coronavirus 2 (Hospital order, Performed in [place] hospital lab) Nasopharyngeal Nasopharyngeal Swab     Status: None   Collection Time: 08/06/19 11:32 PM   Specimen: Nasopharyngeal Swab  Result Value Ref Range   SARS Coronavirus 2 NEGATIVE NEGATIVE  CBC     Status: Abnormal   Collection Time: 08/07/19 12:59 AM  Result Value Ref Range   WBC 8.3 4.0 - 10.5 K/uL   RBC 4.46 3.87 - 5.11 MIL/uL   Hemoglobin 11.6 (L) 12.0 - 15.0 g/dL   HCT 36.0 36.0 - 46.0 %   MCV 80.7 80.0 - 100.0 fL   MCH 26.0 26.0 - 34.0 pg   MCHC 32.2 30.0 - 36.0 g/dL   RDW 14.7 11.5 - 15.5 %   Platelets 245 150 - 400 K/uL   nRBC 0.0 0.0 - 0.2 %  Type and screen [place]     Status: None   Collection Time: 08/07/19 12:59 AM  Result Value Ref Range   ABO/RH(D) O POS    Antibody  Screen NEG    Sample Expiration      08/10/2019,2359 Performed at [facility], [address] [place], [place] [address]     Assessment:   18 y.o. G1P0000 [redacted]w[redacted]d with SROM, labor induction  Plan:   1) Labor - Patient requests epidural for pain relief. Following placement, begin Pitocin and titrate for adequate contraction pattern.  2) Fetus - FWB reassuring, Category I tracing  Avel Sensor, CNM 08/07/2019  11:31 AM

## 2019-08-07 NOTE — Progress Notes (Signed)
Patient began pushing about one hour ago. I was called due to recurrent variable decelerations with each contractions to the 60 with return baseline of 165.   Pitocin discontinued, position changes initiated, IV fluid bolus, fetal scalp stimulation.  No resolution of decelerations. CVX: complete/complete/-2 (mostly caput).   Since patient is remote from delivery and she is not a good candidate for operative vaginal delivery given her level of discomfort, and caput.    Decision made to proceed with stat c-section given the severity of the decelerations. She was given a dose of terbutaline to help ease contractions so that we can get her into the operating room. RNs, OR, Anesthesia aware. Patient consented by me.  Prentice Docker, MD, [facility], [facility] 08/07/2019 7:46 PM  i

## 2019-08-08 ENCOUNTER — Encounter: Payer: Self-pay | Admitting: Obstetrics and Gynecology

## 2019-08-08 ENCOUNTER — Ambulatory Visit (HOSPITAL_COMMUNITY)
Admission: AD | Admit: 2019-08-08 | Discharge: 2019-08-08 | Disposition: A | Discharge status: Home / Self Care | Payer: Managed Care, Other (non HMO) | Source: Other Acute Inpatient Hospital | Attending: Obstetrics and Gynecology | Admitting: Obstetrics and Gynecology

## 2019-08-08 DIAGNOSIS — Z3A00 Weeks of gestation of pregnancy not specified: Secondary | ICD-10-CM | POA: Insufficient documentation

## 2019-08-08 DIAGNOSIS — O2690 Pregnancy related conditions, unspecified, unspecified trimester: Secondary | ICD-10-CM | POA: Insufficient documentation

## 2019-08-08 LAB — CBC
HCT: 27.6 % — ABNORMAL LOW (ref 36.0–46.0)
Hemoglobin: 9.1 g/dL — ABNORMAL LOW (ref 12.0–15.0)
MCH: 26.5 pg (ref 26.0–34.0)
MCHC: 33 g/dL (ref 30.0–36.0)
MCV: 80.5 fL (ref 80.0–100.0)
Platelets: 190 10*3/uL (ref 150–400)
RBC: 3.43 MIL/uL — ABNORMAL LOW (ref 3.87–5.11)
RDW: 14.6 % (ref 11.5–15.5)
WBC: 12.9 10*3/uL — ABNORMAL HIGH (ref 4.0–10.5)
nRBC: 0 % (ref 0.0–0.2)

## 2019-08-08 MED ORDER — PROMETHAZINE HCL 25 MG PO TABS
12.5000 mg | ORAL_TABLET | Freq: Four times a day (QID) | ORAL | Status: DC | PRN
  Administered 2019-08-08: 12.5 mg via ORAL
  Filled 2019-08-08: qty 1

## 2019-08-08 NOTE — Anesthesia Postprocedure Evaluation (Signed)
Anesthesia Post Note  Patient: Lisa Solis  Procedure(s) Performed: CESAREAN SECTION (N/A Abdomen)  Anesthesia Type: Epidural     Last Vitals:  Vitals:   08/08/19 0155 08/08/19 0159  BP:  114/60  Pulse:  61  Resp:  20  Temp:  37.1 C  SpO2: 97%     Last Pain:  Vitals:   08/08/19 0159  TempSrc: Oral  PainSc:        Patient transported to [facility] with baby.            Hedda Slade

## 2019-08-08 NOTE — Anesthesia Procedure Notes (Signed)
Spinal  Patient location during procedure: OR Staffing Anesthesiologist: Grabiela Wohlford, MD Performed: anesthesiologist  Preanesthetic Checklist Completed: patient identified, site marked, surgical consent, pre-op evaluation, timeout performed, IV checked and risks and benefits discussed Spinal Block Patient position: sitting Prep: ChloraPrep Patient monitoring: heart rate, cardiac monitor, continuous pulse ox and blood pressure Approach: midline Location: L3-4 Injection technique: single-shot Needle Needle type: Pencil-Tip  Needle gauge: 25 G Needle length: 9 cm Assessment Sensory level: T10     

## 2019-08-08 NOTE — Progress Notes (Signed)
Baby transferred to [facility]. Patient also  transferred to [facility] to maintain continuity of care. Patient left in stable condition via [facility].

## 2019-08-14 ENCOUNTER — Encounter: Payer: Managed Care, Other (non HMO) | Admitting: Maternal Newborn

## 2019-08-23 ENCOUNTER — Other Ambulatory Visit: Payer: Self-pay

## 2019-08-23 ENCOUNTER — Ambulatory Visit (INDEPENDENT_AMBULATORY_CARE_PROVIDER_SITE_OTHER): Payer: Managed Care, Other (non HMO) | Admitting: Obstetrics and Gynecology

## 2019-08-23 ENCOUNTER — Encounter: Payer: Self-pay | Admitting: Obstetrics and Gynecology

## 2019-08-23 VITALS — BP 101/71 | HR 77 | Ht 59.0 in | Wt 104.0 lb

## 2019-08-23 DIAGNOSIS — R338 Other retention of urine: Secondary | ICD-10-CM

## 2019-08-23 DIAGNOSIS — Z09 Encounter for follow-up examination after completed treatment for conditions other than malignant neoplasm: Secondary | ICD-10-CM

## 2019-08-23 DIAGNOSIS — N9989 Other postprocedural complications and disorders of genitourinary system: Secondary | ICD-10-CM

## 2019-08-23 NOTE — Progress Notes (Signed)
Postoperative Follow-up Patient presents post op from cesarean section  9 days ago.  Subjective: She denies fever, chills, nausea and vomiting. Eating a regular diet without difficulty. The patient is not having any pain.  Activity: increasing appropriately. She denies issues with her incision.   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 Her infant required cooling protocol at [facility] and she was transferred to [facility], as well.  Her hospital course was complicated by acute blood loss anemia, requiring a transfusion of 1 unit pRBCs.  She also had urinary retention (good output. So, no concern for damage to ureters at [facility]).  She was discharged with a catheter and leg bag. She presents to have this removed in clinic today.  She denies dysuria, blood in her urine, or any urinary symptoms.    Objective: BP 101/71 (BP Location: Left Arm, Patient Position: Sitting, Cuff Size: Normal)   Pulse 77   Ht 4' 11"  (1.499 m)   Wt 104 lb (47.2 kg)   LMP 10/22/2018 (Approximate)   BMI 21.01 kg/m   Constitutional: Well nourished, well developed female in no acute distress.  HEENT: normal Skin: Warm and dry.  Abdomen: Soft, non-tender, normal bowel sounds; no bruits, organomegaly.  Uterus at U-4 clean, dry, intact and without erythema, induration, warmth, and tenderness Extremity: no edema   Procedure - Formal voiding trial. 1) Disconnected catheter from Foley bag tubing. 2) Slowly instilled 240 mL sterile saline into patient's bladder through foley or until patient statee that she has a very strong urge to void. 3) Recorded the amount of saline instilled in the bladder, 240 mL 4) Without letting any urine spill from the foley tubing (clamped off portion of foley tube that connects to bag tubing), removed the foley catheter 5) Give the patient 1 hour to void into a hat. 6) She voided 140 mL within 20 minutes of  catheter removal, which is greater than 50 %.  She states that she was unable to void at all at [facility]. She was given instruction on voiding at home. If she develops lower abdominal pain and is unable to void, she should report to the ER immediately.   Assessment: 18 y.o. s/p cesarean section progressing well  Plan: Patient has done well after surgery with no apparent complications.  I have discussed the post-operative course to date, and the expected progress moving forward.  The patient understands what complications to be concerned about.    Activity plan: increase slowly  Urinary retention: She passed her formal voiding trial today.  Precautions given, as above.  The infant is doing well and is at home.  To this point, he has no neurologic deficits and she was told that she could expect normal development, even if milestones were met slightly later than normal.   We discussed in detail the events surrounding her C-section and rupture of her uterus.  We will need an in-depth discussion regarding future pregnancies and method of delivery.  I expressed an apology to the patient for all that happened.  We did discuss that she was treated in a timely manner and that all that could have been done was done with an unrecognized uterine rupture that likely occurred while she was on the table for her C-section.  She voiced understanding and appreciation for the care.  Contraception: She is considering Depo-Provera injections.  We will begin at her 6-week visit.  She is breast-feeding and formula feeding at this time.  Return in about 4 weeks (around 09/20/2019) for Six Week Postpartum.  Prentice Docker, MD 08/23/2019 5:37 PM

## 2019-08-28 ENCOUNTER — Telehealth: Payer: Self-pay

## 2019-08-28 NOTE — Telephone Encounter (Signed)
Patient trying to get her records sent over to her Short term disability case [employer]. She also would like to discuss w/RPH some things she has going on. NP#[phone]

## 2019-08-29 NOTE — Telephone Encounter (Signed)
Spoke w/patient. She is having to turn her Short Term Disability into [place] and her Case Manager is requesting medical records to support this. Patient advised that a Release of Information form needs to be completed. Will send message to [facility] in Medical records to initiate this. Records need to be faxed to [facility] [phone]. Advised patient Long term disability will likely send [place] forms to complete for her. In this case, there is another form the patient will need to complete for [place] to let us know the dates she will be out of work and the reason to assist [place] with completing her paperwork. Fees for forms given $25 for either FMLA/Disability or $40/both.

## 2019-08-29 NOTE — Telephone Encounter (Signed)
[facility] regarding disability records request and to give details on questions she has for [facility].

## 2019-08-29 NOTE — Telephone Encounter (Signed)
Called patient left message for her to call me back about records that is needed.

## 2019-08-29 NOTE — Telephone Encounter (Addendum)
Regarding questions for RPH/SDJ. She is having burning with urination since her catheter was removed last week and her urine is becoming cloudy. She is inquiring if she needs to be seen sooner than 09/20/2019 f/u apt to have that checked out.

## 2019-08-29 NOTE — Telephone Encounter (Signed)
She should have an appointment, at least to have her urine checked. Not sure whether I have availability until Friday, though.  My first choice, since I delivered her and she had a complicated delivery, I would prefer to be the one to see her. But, if Dr. Kenton Kingfisher has an earlier visit available, then that is fine, too.  I would leave that up to him.

## 2019-08-30 NOTE — Telephone Encounter (Signed)
Patient is schedule 08/31/19 at 11:30

## 2019-08-30 NOTE — Telephone Encounter (Signed)
Please put pt on SDJ schedule for tomorrow (9/25) for 11:30. Thank you. Pt aware

## 2019-08-31 ENCOUNTER — Encounter: Payer: Self-pay | Admitting: Obstetrics and Gynecology

## 2019-08-31 ENCOUNTER — Other Ambulatory Visit: Payer: Self-pay

## 2019-08-31 ENCOUNTER — Ambulatory Visit (INDEPENDENT_AMBULATORY_CARE_PROVIDER_SITE_OTHER): Payer: Managed Care, Other (non HMO) | Admitting: Obstetrics and Gynecology

## 2019-08-31 VITALS — BP 100/70 | Ht 59.0 in | Wt 104.0 lb

## 2019-08-31 DIAGNOSIS — R300 Dysuria: Secondary | ICD-10-CM

## 2019-08-31 LAB — POCT URINALYSIS DIPSTICK
Bilirubin, UA: NEGATIVE
Glucose, UA: NEGATIVE
Ketones, UA: NEGATIVE
Nitrite, UA: NEGATIVE
Protein, UA: POSITIVE — AB
Spec Grav, UA: 1.02 (ref 1.010–1.025)
Urobilinogen, UA: 0.2 E.U./dL
pH, UA: 6 (ref 5.0–8.0)

## 2019-08-31 MED ORDER — NITROFURANTOIN MONOHYD MACRO 100 MG PO CAPS: 100.0000 mg | ORAL_CAPSULE | Freq: Two times a day (BID) | ORAL | 0 refills | Status: AC

## 2019-08-31 NOTE — Progress Notes (Signed)
Obstetrics & Gynecology Office Visit   Chief Complaint:  Chief Complaint  Patient presents with  . Urinary Tract Infection    burning when urinating, some frequency, no blood in urine or abnormal pain x 1 week    History of Present Illness: Urinary Tract Infection: Patient complains of burning with urination and frequency She has had symptoms for 7 days. Patient also complains of no other symptoms. Patient denies back pain, fever and stomach ache. Patient does not have a history of recurrent UTI.  Patient does not have a history of pyelonephritis.    Past Medical History:  Diagnosis Date  . Heart murmur    ages 15-13  . Ovarian cyst     Past Surgical History:  Procedure Laterality Date  . CESAREAN SECTION N/A 08/07/2019   Procedure: CESAREAN SECTION;  Surgeon: Conard Novak, MD;  Location: [facility] ORS;  Service: Obstetrics;  Laterality: N/A;  . CYST REMOVAL NECK      Gynecologic History: No LMP recorded.  Obstetric History: G1P1001  Family History  Problem Relation Age of Onset  . Hypertension Mother     Social History   Socioeconomic History  . Marital status: Single    Spouse name: Not on file  . Number of children: Not on file  . Years of education: Not on file  . Highest education level: Not on file  Occupational History  . Not on file  Social Needs  . Financial resource strain: Not on file  . Food insecurity    Worry: Not on file    Inability: Not on file  . Transportation needs    Medical: No    Non-medical: No  Tobacco Use  . Smoking status: Former Smoker    Types: E-cigarettes  . Smokeless tobacco: Never Used  Substance and Sexual Activity  . Alcohol use: No  . Drug use: Never  . Sexual activity: Not Currently    Birth control/protection: None    Comment: arm  Lifestyle  . Physical activity    Days per week: Not on file    Minutes per session: Not on file  . Stress: Not on file  Relationships  . Social Musician on phone: Not  on file    Gets together: Not on file    Attends religious service: Not on file    Active member of club or organization: Not on file    Attends meetings of clubs or organizations: Not on file    Relationship status: Not on file  . Intimate partner violence    Fear of current or ex partner: No    Emotionally abused: No    Physically abused: No    Forced sexual activity: No  Other Topics Concern  . Not on file  Social History Narrative  . Not on file    No Known Allergies  Prior to Admission medications   Denies    Review of Systems  Constitutional: Negative.  Negative for chills and fever.  HENT: Negative.   Eyes: Negative.   Respiratory: Negative.   Cardiovascular: Negative.   Gastrointestinal: Negative.  Negative for abdominal pain.  Genitourinary: Positive for dysuria and frequency. Negative for flank pain, hematuria and urgency.  Musculoskeletal: Negative.   Skin: Negative.   Neurological: Negative.   Psychiatric/Behavioral: Negative.      Physical Exam BP 100/70   Ht 4\' 11"  (1.499 m)   Wt 104 lb (47.2 kg)   Breastfeeding Yes   BMI 21.01 kg/m  No LMP recorded. Physical Exam Constitutional:      General: She is not in acute distress.    Appearance: Normal appearance. She is well-developed.  HENT:     Head: Normocephalic and atraumatic.  Eyes:     General: No scleral icterus.    Conjunctiva/sclera: Conjunctivae normal.  Neck:     Musculoskeletal: Normal range of motion and neck supple.  Cardiovascular:     Rate and Rhythm: Normal rate and regular rhythm.     Heart sounds: No murmur. No friction rub. No gallop.   Pulmonary:     Effort: Pulmonary effort is normal. No respiratory distress.     Breath sounds: Normal breath sounds. No wheezing or rales.  Abdominal:     General: Bowel sounds are normal. There is no distension.     Palpations: Abdomen is soft. There is no mass.     Tenderness: There is no abdominal tenderness. There is no right CVA  tenderness, left CVA tenderness, guarding or rebound.  Musculoskeletal: Normal range of motion.  Neurological:     General: No focal deficit present.     Mental Status: She is alert and oriented to person, place, and time.     Cranial Nerves: No cranial nerve deficit.  Skin:    General: Skin is warm and dry.     Findings: No erythema.  Psychiatric:        Mood and Affect: Mood normal.        Behavior: Behavior normal.        Judgment: Judgment normal.     Female chaperone present for pelvic and breast  portions of the physical exam  Assessment: 18 y.o. G32P1001 female here for  1. Dysuria      Plan: Problem List Items Addressed This Visit    None    Visit Diagnoses    Dysuria    -  Primary     Patient with prolonged catheterization at [facility].  Very suspicious for UTI. WIll treat empirically.  Send urine for culture. She was encouraged to let me know if she develops back pain, fevers, chills.    Prentice Docker, MD 08/31/2019 12:07 PM

## 2019-09-02 LAB — URINE CULTURE

## 2019-09-04 ENCOUNTER — Telehealth: Payer: Self-pay | Admitting: Obstetrics and Gynecology

## 2019-09-04 NOTE — Telephone Encounter (Signed)
Left generic voicemail for patient to call me back.  I did not mention this in her voicemail.  However, she may need a change in antibiotics given that pseudomonas aeruginosa grew out in her urine culture.  This is normally not sensitive to Macrobid.

## 2019-09-20 ENCOUNTER — Other Ambulatory Visit: Payer: Self-pay

## 2019-09-20 ENCOUNTER — Encounter: Payer: Self-pay | Admitting: Obstetrics and Gynecology

## 2019-09-20 ENCOUNTER — Ambulatory Visit (INDEPENDENT_AMBULATORY_CARE_PROVIDER_SITE_OTHER): Payer: Managed Care, Other (non HMO) | Admitting: Obstetrics and Gynecology

## 2019-09-20 ENCOUNTER — Other Ambulatory Visit (HOSPITAL_COMMUNITY)
Admission: RE | Admit: 2019-09-20 | Discharge: 2019-09-20 | Disposition: A | Discharge status: Home / Self Care | Payer: Managed Care, Other (non HMO) | Source: Ambulatory Visit | Attending: Obstetrics and Gynecology | Admitting: Obstetrics and Gynecology

## 2019-09-20 DIAGNOSIS — Z30017 Encounter for initial prescription of implantable subdermal contraceptive: Secondary | ICD-10-CM | POA: Diagnosis not present

## 2019-09-20 DIAGNOSIS — Z113 Encounter for screening for infections with a predominantly sexual mode of transmission: Secondary | ICD-10-CM | POA: Diagnosis present

## 2019-09-20 DIAGNOSIS — N3000 Acute cystitis without hematuria: Secondary | ICD-10-CM

## 2019-09-20 MED ORDER — NEXPLANON 68 MG ~~LOC~~ IMPL: 1.0000 | DRUG_IMPLANT | Freq: Once | SUBCUTANEOUS | 0 refills | Status: DC

## 2019-09-20 MED ORDER — CIPROFLOXACIN HCL 500 MG PO TABS: 500.0000 mg | ORAL_TABLET | Freq: Two times a day (BID) | ORAL | 0 refills | Status: DC

## 2019-09-20 NOTE — Progress Notes (Signed)
Postpartum Visit   Chief Complaint  Patient presents with  . Postpartum Care    no concerns    History of Present Illness: Patient is a 18 y.o. G1P1001 presents for postpartum visit.  Date of delivery: 08/07/2019 Type of delivery: Emergency cesarean section Episiotomy No.  Laceration: not applicable  Pregnancy or labor problems:  Rupture of uterus during second stage of labor necessitating emergency cesarean delivery.  She also had chlamydia during her pregnancy and at the time of delivery. She did receive treatment at the hospital during her admission for delivery. Any problems since the delivery:  UTI, she was treated, but the antibiotic does not cover pseudomonas very well.  She still has some cloudy urine and discomfort.  Not as bad as previously.   Newborn Details:  SINGLETON :  1. Birth weight: 6 lb 8 oz (2,950 grams) Maternal Details:  Breast Feeding:  no Post partum depression/anxiety noted:  no Edinburgh Post-Partum Depression Score:  10  Date of last PAP: n/a  She would like a Nexplanon for birth control.   Past Medical History:  Diagnosis Date  . Heart murmur    ages 5612-11  . Ovarian cyst     Past Surgical History:  Procedure Laterality Date  . CESAREAN SECTION N/A 08/07/2019   Procedure: CESAREAN SECTION;  Surgeon: Conard NovakJackson, Stephen D, MD;  Location: [facility] ORS;  Service: Obstetrics;  Laterality: N/A;  . CYST REMOVAL NECK      Prior to Admission medications   Denies   Allergies: No Known Allergies   Social History   Socioeconomic History  . Marital status: Single    Spouse name: Not on file  . Number of children: Not on file  . Years of education: Not on file  . Highest education level: Not on file  Occupational History  . Not on file  Social Needs  . Financial resource strain: Not on file  . Food insecurity    Worry: Not on file    Inability: Not on file  . Transportation needs    Medical: No    Non-medical: No  Tobacco Use  . Smoking status:  Former Smoker    Types: E-cigarettes  . Smokeless tobacco: Never Used  Substance and Sexual Activity  . Alcohol use: No  . Drug use: Never  . Sexual activity: Not Currently    Birth control/protection: None  Lifestyle  . Physical activity    Days per week: Not on file    Minutes per session: Not on file  . Stress: Not on file  Relationships  . Social Musicianconnections    Talks on phone: Not on file    Gets together: Not on file    Attends religious service: Not on file    Active member of club or organization: Not on file    Attends meetings of clubs or organizations: Not on file    Relationship status: Not on file  . Intimate partner violence    Fear of current or ex partner: No    Emotionally abused: No    Physically abused: No    Forced sexual activity: No  Other Topics Concern  . Not on file  Social History Narrative  . Not on file    Family History  Problem Relation Age of Onset  . Hypertension Mother     Review of Systems  Constitutional: Negative.   HENT: Negative.   Eyes: Negative.   Respiratory: Negative.   Cardiovascular: Negative.   Gastrointestinal: Negative.  Genitourinary: Positive for dysuria (occasional). Negative for flank pain, frequency, hematuria and urgency.  Musculoskeletal: Negative.   Skin: Negative.   Neurological: Negative.   Psychiatric/Behavioral: Negative.      Physical Exam BP 115/84   Ht 4\' 11"  (1.499 m)   Wt 103 lb (46.7 kg)   LMP 09/07/2019 (Approximate)   Breastfeeding No   BMI 20.80 kg/m   Physical Exam Constitutional:      General: She is not in acute distress.    Appearance: Normal appearance. She is well-developed.  Genitourinary:     Pelvic exam was performed with patient in the lithotomy position.     Vulva, inguinal canal, urethra, bladder, vagina, uterus, right adnexa and left adnexa normal.     No posterior fourchette tenderness, injury or lesion present.     Cervical discharge (white, thin) present.     No  cervical friability, lesion, bleeding or polyp.  HENT:     Head: Normocephalic and atraumatic.  Eyes:     General: No scleral icterus.    Conjunctiva/sclera: Conjunctivae normal.  Neck:     Musculoskeletal: Normal range of motion and neck supple.  Cardiovascular:     Rate and Rhythm: Normal rate and regular rhythm.     Heart sounds: No murmur. No friction rub. No gallop.   Pulmonary:     Effort: Pulmonary effort is normal. No respiratory distress.     Breath sounds: Normal breath sounds. No wheezing or rales.  Abdominal:     General: Bowel sounds are normal. There is no distension.     Palpations: Abdomen is soft. There is no mass.     Tenderness: There is no abdominal tenderness. There is no guarding or rebound.     Comments: Well-healed Pfannenstiel incision without erythema, induration, warmth, or tenderness  Musculoskeletal: Normal range of motion.  Neurological:     General: No focal deficit present.     Mental Status: She is alert and oriented to person, place, and time.     Cranial Nerves: No cranial nerve deficit.  Skin:    General: Skin is warm and dry.     Findings: No erythema.  Psychiatric:        Mood and Affect: Mood normal.        Behavior: Behavior normal.        Judgment: Judgment normal.    Female Chaperone present during breast and/or pelvic exam.   GYNECOLOGY PROCEDURE NOTE  Patient is a 18 y.o. G1P1001 presenting for Nexplanon insertion as her desires means of contraception.  She provided informed consent, signed copy in the chart, time out was performed. Self reported LMP of Patient's last menstrual period was 09/07/2019 (approximate).  She understands that Nexplanon is a progesterone only therapy, and that patients often patients have irregular and unpredictable vaginal bleeding or amenorrhea. She understands that other side effects are possible related to systemic progesterone, including but not limited to, headaches, breast tenderness, nausea, and  irritability. While effective at preventing pregnancy long acting reversible contraceptives do not prevent transmission of sexually transmitted diseases and use of barrier methods for this purpose was discussed. The placement procedure for Nexplanon was reviewed with the patient in detail including risks of nerve injury, infection, bleeding and injury to other muscles or tendons. She understands that the Nexplanon implant is good for 3 years and needs to be removed at the end of that time.  She understands that Nexplanon is an extremely effective option for contraception, with failure rate of <  1%. This information is reviewed today and all questions were answered. Informed consent was obtained, both verbally and written.   The patient is healthy and has no contraindications to Nexplanon use. Urine pregnancy test was performed today and was negative.  Procedure Appropriate time out taken.  Patient placed in dorsal supine with left arm above head, elbow flexed at 90 degrees, arm resting on examination table with hand behind her head.  The bicipital grove was palpated and site 8-10cm proximal to the medial epicondyle was indentified.  Per the manufacturer's recommendations, the insertion site was marked along a line 3-5 cm posterior (toward the triceps) to the bicipital groove and at 8-10 cm medial to the medial epicondyle. The insertion site was prepped with a two betadine swabs and then injected with 3 cc of 1% lidocaine without epinephrine.  Nexplanon removed form sterile blister packaging,  Device confirmed in needle, before inserting full length of needle, tenting up the skin as the needle was advance.  The drug eluting rod was then deployed by pulling back the slider per the manufactures recommendation.  The implant was palpable by the clinician as well as the patient.  The insertion site covered dressed with a 1/2" steri-strip before applying  a kerlex bandage pressure dressing..Minimal blood loss was noted  during the procedure.  The patient tolerated the procedure well.   She was instructed to wear the bandage for 24 hours, call with any signs of infection.  She was given the Nexplanon card and instructed to have the rod removed in 3 years.   Assessment: 18 y.o. G1P1001 presenting for 6 week postpartum visit  Plan: Problem List Items Addressed This Visit    None    Visit Diagnoses    Postpartum care and examination    -  Primary   Relevant Medications   etonogestrel (NEXPLANON) 68 MG IMPL implant   ciprofloxacin (CIPRO) 500 MG tablet   Other Relevant Orders   Cervicovaginal ancillary only   Nexplanon insertion       Relevant Medications   etonogestrel (NEXPLANON) 68 MG IMPL implant   Acute cystitis without hematuria       Relevant Medications   ciprofloxacin (CIPRO) 500 MG tablet   Screen for STD (sexually transmitted disease)       Relevant Orders   Cervicovaginal ancillary only       1) Contraception Education given regarding options for contraception, including nexplanon. Nexplanon placed today.   2)  Pap: not due.  STD screening for test of cure  3) Patient underwent screening for postpartum depression with no concerns noted.  4) UTI with pseudomonas: treat with ciprofloxacin.  Sensitivities from urine culture show good response to this medication and she has failed nitrofurantoin.   5) Chlamydia during pregnancy: test of cure today  6) Follow up 1 year for routine annual exam  Thomasene Mohair, MD 09/20/2019 12:37 PM

## 2019-09-26 LAB — CERVICOVAGINAL ANCILLARY ONLY
Chlamydia: NEGATIVE
Comment: NEGATIVE
Comment: NEGATIVE
Comment: NORMAL
Neisseria Gonorrhea: NEGATIVE
Trichomonas: NEGATIVE

## 2019-10-01 ENCOUNTER — Other Ambulatory Visit: Payer: Self-pay | Admitting: Obstetrics and Gynecology

## 2019-10-01 DIAGNOSIS — N3000 Acute cystitis without hematuria: Secondary | ICD-10-CM

## 2019-10-01 MED ORDER — CIPROFLOXACIN 500 MG/5ML (10%) PO SUSR: 500.0000 mg | Freq: Two times a day (BID) | ORAL | 0 refills | Status: AC

## 2019-10-01 NOTE — Telephone Encounter (Signed)
I responded to this in a different way. If you could write her a return to note work for the date she requested with the restrictions of no heavy lifting (>25 lb) until she is six weeks postpartum, I would appreciate it. thanks

## 2019-10-01 NOTE — Telephone Encounter (Signed)
Patient states she originally asked [employer] to write her a Doctors note for her job stating that she can not lift anything between 20-25 lbs for another few weeks. OF#[phone]

## 2019-10-25 NOTE — Telephone Encounter (Signed)
It looks OK overall in the picture. However, she should probably be soon to be sure.

## 2019-10-26 ENCOUNTER — Ambulatory Visit (INDEPENDENT_AMBULATORY_CARE_PROVIDER_SITE_OTHER): Payer: Managed Care, Other (non HMO) | Admitting: Obstetrics and Gynecology

## 2019-10-26 ENCOUNTER — Encounter: Payer: Self-pay | Admitting: Obstetrics and Gynecology

## 2019-10-26 ENCOUNTER — Other Ambulatory Visit: Payer: Self-pay

## 2019-10-26 VITALS — BP 110/60 | Ht 59.0 in | Wt 105.0 lb

## 2019-10-26 DIAGNOSIS — Z09 Encounter for follow-up examination after completed treatment for conditions other than malignant neoplasm: Secondary | ICD-10-CM

## 2019-10-26 NOTE — Telephone Encounter (Signed)
Called and spoke with patient to schedule appointment. Patient is at work until 3:30 and is going to check with her manager to see if she would be able to come to appointment at 2:30. Waiting on patient to call back to be schedule

## 2019-10-26 NOTE — Progress Notes (Signed)
   Postoperative Follow-up Patient presents post op from cesarean section  2.5 months ago.  Subjective: She denies fever, chills, nausea and vomiting. Eating a regular diet without difficulty. She is having some pain on the right aspect of her incision and just above her incision in the midline.  Activity: normal activities of daily living. She denies issues with her incision, in terms of drainage or opening of the incision.    Objective: BP 110/60   Ht 4\' 11"  (1.499 m)   Wt 105 lb (47.6 kg)   Breastfeeding No   BMI 21.21 kg/m   Constitutional: Well nourished, well developed female in no acute distress.  HEENT: normal Skin: Warm and dry.  Abdomen: Soft, non-tender, normal bowel sounds; no bruits, organomegaly or masses. clean, dry, intact and without erythema, induration, warmth, and tenderness Extremity: no edema   Assessment: 18 y.o. s/p cesarean section progressing well  Plan: Patient has done well after surgery with no apparent complications.  I have discussed the post-operative course to date, and the expected progress moving forward.  The patient understands what complications to be concerned about.    Activity plan: No restriction.  Patient progressing well. Discussed the normal sensations of the healing process.  Encouraged her to follow up for all concerns.   Return in about 10 months (around 08/25/2020) for Annual Gynecologic Examination.  Prentice Docker, MD 10/26/2019 2:53 PM

## 2020-08-06 DIAGNOSIS — U071 COVID-19: Secondary | ICD-10-CM

## 2020-08-06 HISTORY — DX: COVID-19: U07.1

## 2020-10-09 ENCOUNTER — Ambulatory Visit
Admission: RE | Admit: 2020-10-09 | Discharge: 2020-10-09 | Disposition: A | Discharge status: Home / Self Care | Payer: Medicaid Other | Source: Ambulatory Visit | Attending: Family Medicine | Admitting: Family Medicine

## 2020-10-09 ENCOUNTER — Other Ambulatory Visit: Payer: Self-pay

## 2020-10-09 VITALS — BP 110/76 | HR 88 | Temp 98.1°F | Resp 16 | Ht <= 58 in | Wt 114.0 lb

## 2020-10-09 DIAGNOSIS — R509 Fever, unspecified: Secondary | ICD-10-CM

## 2020-10-09 DIAGNOSIS — R109 Unspecified abdominal pain: Secondary | ICD-10-CM

## 2020-10-09 DIAGNOSIS — R5383 Other fatigue: Secondary | ICD-10-CM | POA: Diagnosis not present

## 2020-10-09 DIAGNOSIS — R519 Headache, unspecified: Secondary | ICD-10-CM | POA: Diagnosis not present

## 2020-10-09 DIAGNOSIS — B349 Viral infection, unspecified: Secondary | ICD-10-CM

## 2020-10-09 DIAGNOSIS — R197 Diarrhea, unspecified: Secondary | ICD-10-CM

## 2020-10-09 DIAGNOSIS — Z1152 Encounter for screening for COVID-19: Secondary | ICD-10-CM

## 2020-10-09 DIAGNOSIS — R52 Pain, unspecified: Secondary | ICD-10-CM

## 2020-10-09 MED ORDER — DICYCLOMINE HCL 20 MG PO TABS: 20.0000 mg | ORAL_TABLET | Freq: Two times a day (BID) | ORAL | 0 refills | Status: DC

## 2020-10-09 MED ORDER — LOPERAMIDE HCL 2 MG PO CAPS: 2.0000 mg | ORAL_CAPSULE | Freq: Four times a day (QID) | ORAL | 0 refills | Status: DC | PRN

## 2020-10-09 NOTE — ED Provider Notes (Signed)
[facility]   [id number] 10/09/20 Arrival Time: 1033   CC: COVID symptoms  SUBJECTIVE: History from: patient.  Lisa Solis is a 19 y.o. female who presents with abrupt onset of nasal congestion, PND, fatigue, body aches for the last 3 days. Denies sick exposure to COVID, flu or strep. Denies recent travel. Has positive history of Covid. Has not completed Covid vaccines. Has not taken OTC medications for this. There are no aggravating or alleviating factors. Denies previous symptoms in the past. Denies sinus pain, rhinorrhea, sore throat, SOB, wheezing, chest pain, nausea, changes in bladder habits.    ROS: As per HPI.  All other pertinent ROS negative.     Past Medical History:  Diagnosis Date  . Heart murmur    ages 37-13  . Ovarian cyst    Past Surgical History:  Procedure Laterality Date  . CESAREAN SECTION N/A 08/07/2019   Procedure: CESAREAN SECTION;  Surgeon: Conard Novak, MD;  Location: [facility] ORS;  Service: Obstetrics;  Laterality: N/A;  . CYST REMOVAL NECK     No Known Allergies No current facility-administered medications on file prior to encounter.   Current Outpatient Medications on File Prior to Encounter  Medication Sig Dispense Refill  . CIPRO 500 MG/5ML (10%) suspension     . etonogestrel (NEXPLANON) 68 MG IMPL implant 1 each (68 mg total) by Subdermal route once for 1 dose. 1 each 0   Social History   Socioeconomic History  . Marital status: Single    Spouse name: Not on file  . Number of children: Not on file  . Years of education: Not on file  . Highest education level: Not on file  Occupational History  . Not on file  Tobacco Use  . Smoking status: Former Smoker    Types: E-cigarettes  . Smokeless tobacco: Never Used  Vaping Use  . Vaping Use: Never used  Substance and Sexual Activity  . Alcohol use: No  . Drug use: Never  . Sexual activity: Yes    Birth control/protection: Implant  Other Topics Concern  . Not on file   Social History Narrative  . Not on file   Social Determinants of Health   Financial Resource Strain:   . Difficulty of Paying Living Expenses: Not on file  Food Insecurity:   . Worried About Programme researcher, broadcasting/film/video in the Last Year: Not on file  . Ran Out of Food in the Last Year: Not on file  Transportation Needs:   . Lack of Transportation (Medical): Not on file  . Lack of Transportation (Non-Medical): Not on file  Physical Activity:   . Days of Exercise per Week: Not on file  . Minutes of Exercise per Session: Not on file  Stress:   . Feeling of Stress : Not on file  Social Connections:   . Frequency of Communication with Friends and Family: Not on file  . Frequency of Social Gatherings with Friends and Family: Not on file  . Attends Religious Services: Not on file  . Active Member of Clubs or Organizations: Not on file  . Attends Banker Meetings: Not on file  . Marital Status: Not on file  Intimate Partner Violence:   . Fear of Current or Ex-Partner: Not on file  . Emotionally Abused: Not on file  . Physically Abused: Not on file  . Sexually Abused: Not on file   Family History  Problem Relation Age of Onset  . Hypertension Mother  OBJECTIVE:  Vitals:   10/09/20 1034 10/09/20 1035  BP: 110/76   Pulse: 88   Resp: 16   Temp: 98.1 F (36.7 C)   TempSrc: Oral   SpO2: 100%   Weight:  114 lb (51.7 kg)  Height:  4' 9.5" (1.461 m)     General appearance: alert; appears fatigued, but nontoxic; speaking in full sentences and tolerating own secretions HEENT: NCAT; Ears: EACs clear, TMs pearly gray; Eyes: PERRL.  EOM grossly intact. Sinuses: nontender; Nose: nares patent without rhinorrhea, Throat: oropharynx clear, tonsils non erythematous or enlarged, uvula midline  Neck: supple without LAD Lungs: unlabored respirations, symmetrical air entry; cough: absent; no respiratory distress; CTAB Heart: regular rate and rhythm.  Radial pulses 2+ symmetrical  bilaterally Skin: warm and dry Psychological: alert and cooperative; normal mood and affect  LABS:  No results found for this or any previous visit (from the past 24 hour(s)).   ASSESSMENT & PLAN:  1. Viral illness   2. Encounter for screening for COVID-19   3. Diarrhea, unspecified type   4. Other fatigue   5. Nonintractable headache, unspecified chronicity pattern, unspecified headache type   6. Abdominal cramping   7. Aches   8. Fever, unspecified fever cause     Meds ordered this encounter  Medications  . dicyclomine (BENTYL) 20 MG tablet    Sig: Take 1 tablet (20 mg total) by mouth 2 (two) times daily.    Dispense:  20 tablet    Refill:  0    Order Specific Question:   Supervising Provider    Answer:   Merrilee Jansky [id number]  . loperamide (IMODIUM) 2 MG capsule    Sig: Take 1 capsule (2 mg total) by mouth 4 (four) times daily as needed for diarrhea or loose stools.    Dispense:  12 capsule    Refill:  0    Order Specific Question:   Supervising Provider    Answer:   Merrilee Jansky [id number]   Prescribed bentyl Prescribed loperamide Gastroenteritis vs Covid/Flu COVID/flu testing ordered.  It will take between 1-2 days for test results.  Someone will contact you regarding abnormal results.    Patient should remain in quarantine until they have received Covid results.  If negative you may resume normal activities (go back to work/school) while practicing hand hygiene, social distance, and mask wearing.  If positive, patient should remain in quarantine for 10 days from symptom onset AND greater than 72 hours after symptoms resolution (absence of fever without the use of fever-reducing medication and improvement in respiratory symptoms), whichever is longer Get plenty of rest and push fluids Use OTC zyrtec for nasal congestion, runny nose, and/or sore throat Use OTC flonase for nasal congestion and runny nose Use medications daily for symptom relief Use OTC  medications like ibuprofen or tylenol as needed fever or pain Call or go to the ED if you have any new or worsening symptoms such as fever, worsening cough, shortness of breath, chest tightness, chest pain, turning blue, changes in mental status.  Reviewed expectations re: course of current medical issues. Questions answered. Outlined signs and symptoms indicating need for more acute intervention. Patient verbalized understanding. After Visit Summary given.         Moshe Cipro, NP 10/09/20 1131

## 2020-10-09 NOTE — Discharge Instructions (Signed)
I have sent in dicyclomine for you to take twice a day as needed for abdominal cramping  I have sent in loperamide for you to use for diarrhea  Your COVID/Flu test is pending.  You should self quarantine until the test result is back.    Take Tylenol as needed for fever or discomfort.  Rest and keep yourself hydrated.    Go to the emergency department if you develop acute worsening symptoms.

## 2020-10-09 NOTE — ED Triage Notes (Signed)
Pt reports having n/v/d, headache and loss of appetite x2 days. No known covid contacts.

## 2020-10-10 LAB — COVID-19, FLU A+B AND RSV
Influenza A, NAA: NOT DETECTED
Influenza B, NAA: NOT DETECTED
RSV, NAA: NOT DETECTED
SARS-CoV-2, NAA: NOT DETECTED

## 2020-10-13 DIAGNOSIS — R112 Nausea with vomiting, unspecified: Secondary | ICD-10-CM | POA: Diagnosis not present

## 2020-10-13 DIAGNOSIS — R197 Diarrhea, unspecified: Secondary | ICD-10-CM | POA: Diagnosis not present

## 2020-10-13 DIAGNOSIS — R109 Unspecified abdominal pain: Secondary | ICD-10-CM | POA: Diagnosis not present

## 2021-01-12 ENCOUNTER — Ambulatory Visit
Admission: EM | Admit: 2021-01-12 | Discharge: 2021-01-12 | Disposition: A | Discharge status: Home / Self Care | Payer: Medicaid Other | Attending: Family Medicine | Admitting: Family Medicine

## 2021-01-12 ENCOUNTER — Other Ambulatory Visit: Payer: Self-pay

## 2021-01-12 ENCOUNTER — Encounter: Payer: Self-pay | Admitting: Emergency Medicine

## 2021-01-12 DIAGNOSIS — R519 Headache, unspecified: Secondary | ICD-10-CM | POA: Diagnosis not present

## 2021-01-12 DIAGNOSIS — Z20828 Contact with and (suspected) exposure to other viral communicable diseases: Secondary | ICD-10-CM | POA: Diagnosis not present

## 2021-01-12 MED ORDER — KETOROLAC TROMETHAMINE 30 MG/ML IJ SOLN
30.0000 mg | Freq: Once | INTRAMUSCULAR | Status: AC
  Administered 2021-01-12: 30 mg via INTRAMUSCULAR

## 2021-01-12 NOTE — ED Triage Notes (Signed)
Pt presents today with headache and eyes burning since yesterday.

## 2021-01-12 NOTE — Discharge Instructions (Signed)
Toradol given here for headache You can take 1000 mg tylenol and 600 mg of ibuprofen every 8 hours for headache.  Rest, cold rag to eyes. Hydrate Covid test pending.  Follow up as needed for continued or worsening symptoms

## 2021-01-12 NOTE — ED Provider Notes (Signed)
Renaldo Fiddler    CSN: [id number] Arrival date & time: 01/12/21  1217      History   Chief Complaint Chief Complaint  Patient presents with  . Headache    HPI Lisa Solis is a 20 y.o. female.   Patient is a 20 year old female presents today with headache.  This started yesterday.  Reporting pressure behind the eyes and burning in the eyes.  The headache is generalized.  No associated nausea, vomiting, photophobia, phonophobia, vision changes, dizziness, nasal congestion, rhinorrhea.  No history of recurrent migraines.  No fever     Past Medical History:  Diagnosis Date  . COVID-19 08/2020  . Heart murmur    ages 24-13  . Ovarian cyst     Patient Active Problem List   Diagnosis Date Noted  . [redacted] weeks gestation of pregnancy 08/07/2019  . Uterine rupture during labor 08/07/2019  . Indication for care in labor and delivery, antepartum 08/06/2019  . Premature rupture of membranes 08/06/2019  . Poor weight gain of pregnancy, third trimester 06/02/2019  . Chlamydia infection affecting pregnancy 03/30/2019  . Supervision of normal first pregnancy, antepartum 01/16/2019    Past Surgical History:  Procedure Laterality Date  . CESAREAN SECTION N/A 08/07/2019   Procedure: CESAREAN SECTION;  Surgeon: Conard Novak, MD;  Location: [facility] ORS;  Service: Obstetrics;  Laterality: N/A;  . CYST REMOVAL NECK      OB History    Gravida  1   Para  1   Term  1   Preterm  0   AB  0   Living  1     SAB  0   IAB  0   Ectopic  0   Multiple  0   Live Births  1            Home Medications    Prior to Admission medications   Medication Sig Start Date End Date Taking? Authorizing Provider  CIPRO 500 MG/5ML (10%) suspension  10/21/19   [provider]  dicyclomine (BENTYL) 20 MG tablet Take 1 tablet (20 mg total) by mouth 2 (two) times daily. 10/09/20   Moshe Cipro, NP  etonogestrel (NEXPLANON) 68 MG IMPL implant 1 each (68 mg total) by  Subdermal route once for 1 dose. 09/20/19 09/20/19  Conard Novak, MD  loperamide (IMODIUM) 2 MG capsule Take 1 capsule (2 mg total) by mouth 4 (four) times daily as needed for diarrhea or loose stools. 10/09/20   Moshe Cipro, NP    Family History Family History  Problem Relation Age of Onset  . Hypertension Mother     Social History Social History   Tobacco Use  . Smoking status: Former Smoker    Types: E-cigarettes  . Smokeless tobacco: Never Used  Vaping Use  . Vaping Use: Never used  Substance Use Topics  . Alcohol use: No  . Drug use: Never     Allergies   Patient has no known allergies.   Review of Systems Review of Systems   Physical Exam Triage Vital Signs ED Triage Vitals  Enc Vitals Group     BP 01/12/21 1225 126/78     Pulse Rate 01/12/21 1225 97     Resp 01/12/21 1225 18     Temp 01/12/21 1225 98.2 F (36.8 C)     Temp Source 01/12/21 1225 Oral     SpO2 01/12/21 1225 97 %     Weight --  Height --      Head Circumference --      Peak Flow --      Pain Score 01/12/21 1226 10     Pain Loc --      Pain Edu? --      Excl. in GC? --    No data found.  Updated Vital Signs BP 126/78 (BP Location: Left Arm)   Pulse 97   Temp 98.2 F (36.8 C) (Oral)   Resp 18   LMP 12/24/2020   SpO2 97%   Visual Acuity Right Eye Distance:   Left Eye Distance:   Bilateral Distance:    Right Eye Near:   Left Eye Near:    Bilateral Near:     Physical Exam Vitals and nursing note reviewed.  Constitutional:      General: She is not in acute distress.    Appearance: Normal appearance. She is not ill-appearing, toxic-appearing or diaphoretic.  HENT:     Head: Normocephalic.     Nose: Nose normal.     Mouth/Throat:     Pharynx: Oropharynx is clear.  Eyes:     Conjunctiva/sclera: Conjunctivae normal.     Pupils: Pupils are equal, round, and reactive to light.  Pulmonary:     Effort: Pulmonary effort is normal.  Musculoskeletal:         General: Normal range of motion.     Cervical back: Normal range of motion.  Skin:    General: Skin is warm and dry.     Findings: No rash.  Neurological:     General: No focal deficit present.     Mental Status: She is alert.  Psychiatric:        Mood and Affect: Mood normal.      UC Treatments / Results  Labs (all labs ordered are listed, but only abnormal results are displayed) Labs Reviewed  NOVEL CORONAVIRUS, NAA    EKG   Radiology No results found.  Procedures Procedures (including critical care time)  Medications Ordered in UC Medications  ketorolac (TORADOL) 30 MG/ML injection 30 mg (30 mg Intramuscular Given 01/12/21 1301)    Initial Impression / Assessment and Plan / UC Course  I have reviewed the triage vital signs and the nursing notes.  Pertinent labs & imaging results that were available during my care of the patient were reviewed by me and considered in my medical decision making (see chart for details).     Headache, most likely tension related.  No concerns on exam and no concerning red flags. Headache could be Covid related.  Covid swab pending. Toradol given here for headache.  Recommended Tylenol and ibuprofen for headache as needed.  Rest, hydrate Follow up as needed for continued or worsening symptoms Final Clinical Impressions(s) / UC Diagnoses   Final diagnoses:  Acute nonintractable headache, unspecified headache type     Discharge Instructions     Toradol given here for headache You can take 1000 mg tylenol and 600 mg of ibuprofen every 8 hours for headache.  Rest, cold rag to eyes. Hydrate Covid test pending.  Follow up as needed for continued or worsening symptoms     ED Prescriptions    None     PDMP not reviewed this encounter.   Dahlia Byes A, NP 01/12/21 [phone]

## 2021-01-13 LAB — SARS-COV-2, NAA 2 DAY TAT

## 2021-01-13 LAB — NOVEL CORONAVIRUS, NAA: SARS-CoV-2, NAA: NOT DETECTED

## 2022-01-22 ENCOUNTER — Emergency Department
Admission: EM | Admit: 2022-01-22 | Discharge: 2022-01-22 | Disposition: A | Discharge status: Home / Self Care | Payer: Medicaid Other | Attending: Emergency Medicine | Admitting: Emergency Medicine

## 2022-01-22 ENCOUNTER — Other Ambulatory Visit: Payer: Self-pay

## 2022-01-22 DIAGNOSIS — K529 Noninfective gastroenteritis and colitis, unspecified: Secondary | ICD-10-CM | POA: Diagnosis not present

## 2022-01-22 DIAGNOSIS — R197 Diarrhea, unspecified: Secondary | ICD-10-CM | POA: Diagnosis present

## 2022-01-22 LAB — COMPREHENSIVE METABOLIC PANEL
ALT: 20 U/L (ref 0–44)
AST: 21 U/L (ref 15–41)
Albumin: 3.9 g/dL (ref 3.5–5.0)
Alkaline Phosphatase: 60 U/L (ref 38–126)
Anion gap: 9 (ref 5–15)
BUN: 20 mg/dL (ref 6–20)
CO2: 23 mmol/L (ref 22–32)
Calcium: 9.2 mg/dL (ref 8.9–10.3)
Chloride: 104 mmol/L (ref 98–111)
Creatinine, Ser: 0.65 mg/dL (ref 0.44–1.00)
GFR, Estimated: >60 mL/min (ref >60)
Glucose, Bld: 111 mg/dL — ABNORMAL HIGH (ref 70–99)
Potassium: 3.9 mmol/L (ref 3.5–5.1)
Sodium: 136 mmol/L (ref 135–145)
Total Bilirubin: 0.5 mg/dL (ref 0.3–1.2)
Total Protein: 7.6 g/dL (ref 6.5–8.1)

## 2022-01-22 LAB — URINALYSIS, ROUTINE W REFLEX MICROSCOPIC
Bilirubin Urine: NEGATIVE
Glucose, UA: NEGATIVE mg/dL
Hgb urine dipstick: NEGATIVE
Ketones, ur: 20 mg/dL — AB
Nitrite: POSITIVE — AB
Protein, ur: 100 mg/dL — AB
Specific Gravity, Urine: 1.031 — ABNORMAL HIGH (ref 1.005–1.030)
pH: 5 (ref 5.0–8.0)

## 2022-01-22 LAB — CBC
HCT: 40.7 % (ref 36.0–46.0)
Hemoglobin: 13.1 g/dL (ref 12.0–15.0)
MCH: 25.7 pg — ABNORMAL LOW (ref 26.0–34.0)
MCHC: 32.2 g/dL (ref 30.0–36.0)
MCV: 80 fL (ref 80.0–100.0)
Platelets: 322 10*3/uL (ref 150–400)
RBC: 5.09 MIL/uL (ref 3.87–5.11)
RDW: 13.3 % (ref 11.5–15.5)
WBC: 13.7 10*3/uL — ABNORMAL HIGH (ref 4.0–10.5)
nRBC: 0 % (ref 0.0–0.2)

## 2022-01-22 LAB — POC URINE PREG, ED: Preg Test, Ur: NEGATIVE

## 2022-01-22 MED ORDER — ONDANSETRON HCL 4 MG/2ML IJ SOLN
4.0000 mg | Freq: Once | INTRAMUSCULAR | Status: AC
  Administered 2022-01-22: 4 mg via INTRAVENOUS
  Filled 2022-01-22: qty 2

## 2022-01-22 MED ORDER — DICYCLOMINE HCL 10 MG PO CAPS: 10.0000 mg | ORAL_CAPSULE | Freq: Three times a day (TID) | ORAL | 0 refills | Status: DC | PRN

## 2022-01-22 MED ORDER — ONDANSETRON HCL 4 MG PO TABS: 4.0000 mg | ORAL_TABLET | Freq: Three times a day (TID) | ORAL | 0 refills | Status: DC | PRN

## 2022-01-22 MED ORDER — NITROFURANTOIN MONOHYD MACRO 100 MG PO CAPS: 100.0000 mg | ORAL_CAPSULE | Freq: Two times a day (BID) | ORAL | 0 refills | Status: AC

## 2022-01-22 MED ORDER — DICYCLOMINE HCL 10 MG PO CAPS
10.0000 mg | ORAL_CAPSULE | Freq: Once | ORAL | Status: AC
  Administered 2022-01-22: 10 mg via ORAL
  Filled 2022-01-22: qty 1

## 2022-01-22 NOTE — Discharge Instructions (Signed)
Please seek medical attention for any high fevers, chest pain, shortness of breath, change in behavior, persistent vomiting, bloody stool or any other new or concerning symptoms.  

## 2022-01-22 NOTE — ED Notes (Signed)
Upon arrival to the room, this nurse found the patient resting on the stretcher with eyes closed and sleeping. Patient was awakened easily. She states that she is still having both nausea, but no vomiting and diarrhea. Patient was taught how to swallow the Bentyl pill since she normally is unable to swallow pills due to mental trauma as a child.

## 2022-01-22 NOTE — ED Notes (Signed)
Discharge instructions provided to patient. Instrutions given on follow-up and scripts. Patient verbalized understanding. Patient ambulated out the waiting room with a steady gait.

## 2022-01-22 NOTE — ED Notes (Signed)
Patient states that she has had generalized abdominal pain since this morning with associated nausea and vomiting. Last emesis was 2 hrs ago. Patient denies burning on urination, frequency or urgency. Patient also states that she has not tried immodium for the diarrhea.

## 2022-01-22 NOTE — ED Provider Notes (Signed)
[facility] Provider Note    Event Date/Time   First MD Initiated Contact with Patient 01/22/22 2156     (approximate)   History   Abdominal Pain, Diarrhea, and Vomiting   HPI  Lisa Solis is a 21 y.o. female  who presents to the emergency department today because of concern for abdominal pain, nausea, vomiting and diarrhea.  Symptoms started shortly after dinner last night.  She states she had crab last night.  They have been fairly persistent since then.  She does get some very brief relief of her abdominal pain after episode of nausea.  She denies any blood in her nausea or diarrhea.  The patient denies any fevers.  Denies history of abdominal problems. Denies any urinary complaints.      Physical Exam   Triage Vital Signs: ED Triage Vitals  Enc Vitals Group     BP 01/22/22 2012 119/80     Pulse Rate 01/22/22 2012 92     Resp 01/22/22 2012 18     Temp 01/22/22 2012 98.1 F (36.7 C)     Temp Source 01/22/22 2012 Oral     SpO2 01/22/22 2012 98 %     Weight 01/22/22 2012 125 lb (56.7 kg)     Height 01/22/22 2012 4\' 9"  (1.448 m)     Head Circumference --      Peak Flow --      Pain Score 01/22/22 2015 8     Pain Loc --      Pain Edu? --      Excl. in GC? --     Most recent vital signs: Vitals:   01/22/22 2012  BP: 119/80  Pulse: 92  Resp: 18  Temp: 98.1 F (36.7 C)  SpO2: 98%    General: Awake, no distress.  CV:  Good peripheral perfusion.  Resp:  Normal effort.  Abd:  No distention. Diffusely tender to palpation.  ED Results / Procedures / Treatments   Labs (all labs ordered are listed, but only abnormal results are displayed) Labs Reviewed  COMPREHENSIVE METABOLIC PANEL - Abnormal; Notable for the following components:      Result Value   Glucose, Bld 111 (*)    All other components within normal limits  CBC - Abnormal; Notable for the following components:   WBC 13.7 (*)    MCH 25.7 (*)    All other components within  normal limits  URINALYSIS, ROUTINE W REFLEX MICROSCOPIC - Abnormal; Notable for the following components:   Color, Urine YELLOW (*)    APPearance CLOUDY (*)    Specific Gravity, Urine 1.031 (*)    Ketones, ur 20 (*)    Protein, ur 100 (*)    Nitrite POSITIVE (*)    Leukocytes,Ua TRACE (*)    Bacteria, UA MANY (*)    All other components within normal limits  POC URINE PREG, ED     EKG  None   RADIOLOGY None   PROCEDURES:  Critical Care performed: No  Procedures   MEDICATIONS ORDERED IN ED: Medications - No data to display   IMPRESSION / MDM / ASSESSMENT AND PLAN / ED COURSE  I reviewed the triage vital signs and the nursing notes.                              Differential diagnosis includes, but is not limited to, gastroenteritis, appendicitis, diverticulitis, pregnancy, UTI.  Patient presented to the emergency department today with concerns for abdominal pain, nausea vomiting and diarrhea.  Patient's exam shows somewhat diffuse abdominal tenderness.  No focal tenderness that would suggest focal infection.  Patient's blood work without concerning leukocytosis.  No concerning LFT elevations.  Patient's urine pregnancy was negative.  Urine however is concerning for infection.  Patient however denies any dysuria.  I do think likely patient suffering from gastroenteritis.  Did discuss with the patient.  Patient did feel improvement after medication.  At this time I do not feel patient necessitates inpatient admission.  We will plan on discharging with medications. Discussed return precautions.  FINAL CLINICAL IMPRESSION(S) / ED DIAGNOSES   Final diagnoses:  Gastroenteritis     Rx / DC Orders   ED Discharge Orders     None        Note:  This document was prepared using Dragon voice recognition software and may include unintentional dictation errors.    Phineas Semen, MD 01/22/22 2351

## 2022-01-22 NOTE — ED Triage Notes (Signed)
Pt with generalized abd pain, vomiting and diarrhea that began this am per pt. Pt denies known fever. Pt appears in no acute distress and denies sick contacts.

## 2022-01-25 ENCOUNTER — Telehealth: Payer: Self-pay

## 2022-01-25 NOTE — Telephone Encounter (Signed)
Transition Care Management Unsuccessful Follow-up Telephone Call  Date of discharge and from where:  01/22/2022 from [facility]  Attempts:  1st Attempt  Reason for unsuccessful TCM follow-up call:  Unable to leave message

## 2022-01-26 NOTE — Telephone Encounter (Signed)
Transition Care Management Unsuccessful Follow-up Telephone Call  Date of discharge and from where:  01/22/2022 from [facility]  Attempts:  2nd Attempt  Reason for unsuccessful TCM follow-up call:  Unable to reach patient

## 2022-01-28 ENCOUNTER — Emergency Department: Payer: Medicaid Other

## 2022-01-28 ENCOUNTER — Other Ambulatory Visit: Payer: Self-pay

## 2022-01-28 ENCOUNTER — Emergency Department
Admission: EM | Admit: 2022-01-28 | Discharge: 2022-01-28 | Disposition: A | Discharge status: Home / Self Care | Payer: Medicaid Other | Attending: Emergency Medicine | Admitting: Emergency Medicine

## 2022-01-28 ENCOUNTER — Encounter: Payer: Self-pay | Admitting: Emergency Medicine

## 2022-01-28 DIAGNOSIS — R112 Nausea with vomiting, unspecified: Secondary | ICD-10-CM | POA: Diagnosis present

## 2022-01-28 DIAGNOSIS — D72829 Elevated white blood cell count, unspecified: Secondary | ICD-10-CM | POA: Diagnosis not present

## 2022-01-28 DIAGNOSIS — E873 Alkalosis: Secondary | ICD-10-CM | POA: Insufficient documentation

## 2022-01-28 DIAGNOSIS — N83201 Unspecified ovarian cyst, right side: Secondary | ICD-10-CM | POA: Insufficient documentation

## 2022-01-28 DIAGNOSIS — Z8616 Personal history of COVID-19: Secondary | ICD-10-CM | POA: Insufficient documentation

## 2022-01-28 DIAGNOSIS — R109 Unspecified abdominal pain: Secondary | ICD-10-CM | POA: Diagnosis not present

## 2022-01-28 DIAGNOSIS — R197 Diarrhea, unspecified: Secondary | ICD-10-CM | POA: Diagnosis not present

## 2022-01-28 DIAGNOSIS — R1110 Vomiting, unspecified: Secondary | ICD-10-CM | POA: Diagnosis not present

## 2022-01-28 LAB — COMPREHENSIVE METABOLIC PANEL
ALT: 24 U/L (ref 0–44)
AST: 25 U/L (ref 15–41)
Albumin: 4 g/dL (ref 3.5–5.0)
Alkaline Phosphatase: 68 U/L (ref 38–126)
Anion gap: 7 (ref 5–15)
BUN: 15 mg/dL (ref 6–20)
CO2: 20 mmol/L — ABNORMAL LOW (ref 22–32)
Calcium: 9 mg/dL (ref 8.9–10.3)
Chloride: 109 mmol/L (ref 98–111)
Creatinine, Ser: 0.59 mg/dL (ref 0.44–1.00)
GFR, Estimated: >60 mL/min (ref >60)
Glucose, Bld: 125 mg/dL — ABNORMAL HIGH (ref 70–99)
Potassium: 4 mmol/L (ref 3.5–5.1)
Sodium: 136 mmol/L (ref 135–145)
Total Bilirubin: 0.2 mg/dL — ABNORMAL LOW (ref 0.3–1.2)
Total Protein: 7.8 g/dL (ref 6.5–8.1)

## 2022-01-28 LAB — CBC WITH DIFFERENTIAL/PLATELET
Abs Immature Granulocytes: 0.09 10*3/uL — ABNORMAL HIGH (ref 0.00–0.07)
Basophils Absolute: 0.1 10*3/uL (ref 0.0–0.1)
Basophils Relative: 0 %
Eosinophils Absolute: 0.2 10*3/uL (ref 0.0–0.5)
Eosinophils Relative: 1 %
HCT: 42.2 % (ref 36.0–46.0)
Hemoglobin: 13.4 g/dL (ref 12.0–15.0)
Immature Granulocytes: 1 %
Lymphocytes Relative: 9 %
Lymphs Abs: 1.7 10*3/uL (ref 0.7–4.0)
MCH: 25.7 pg — ABNORMAL LOW (ref 26.0–34.0)
MCHC: 31.8 g/dL (ref 30.0–36.0)
MCV: 80.8 fL (ref 80.0–100.0)
Monocytes Absolute: 0.8 10*3/uL (ref 0.1–1.0)
Monocytes Relative: 4 %
Neutro Abs: 15.7 10*3/uL — ABNORMAL HIGH (ref 1.7–7.7)
Neutrophils Relative %: 85 %
Platelets: 360 10*3/uL (ref 150–400)
RBC: 5.22 MIL/uL — ABNORMAL HIGH (ref 3.87–5.11)
RDW: 13.6 % (ref 11.5–15.5)
WBC: 18.5 10*3/uL — ABNORMAL HIGH (ref 4.0–10.5)
nRBC: 0 % (ref 0.0–0.2)

## 2022-01-28 LAB — POC URINE PREG, ED: Preg Test, Ur: NEGATIVE

## 2022-01-28 LAB — LIPASE, BLOOD: Lipase: 37 U/L (ref 11–51)

## 2022-01-28 MED ORDER — SODIUM CHLORIDE 0.9 % IV BOLUS
1000.0000 mL | Freq: Once | INTRAVENOUS | Status: AC
  Administered 2022-01-28: 1000 mL via INTRAVENOUS

## 2022-01-28 MED ORDER — IOHEXOL 300 MG/ML  SOLN
100.0000 mL | Freq: Once | INTRAMUSCULAR | Status: AC | PRN
  Administered 2022-01-28: 100 mL via INTRAVENOUS

## 2022-01-28 MED ORDER — KETOROLAC TROMETHAMINE 30 MG/ML IJ SOLN
15.0000 mg | Freq: Once | INTRAMUSCULAR | Status: AC
  Administered 2022-01-28: 15 mg via INTRAVENOUS
  Filled 2022-01-28: qty 1

## 2022-01-28 MED ORDER — ONDANSETRON HCL 4 MG/2ML IJ SOLN
4.0000 mg | Freq: Once | INTRAMUSCULAR | Status: AC
  Administered 2022-01-28: 4 mg via INTRAVENOUS
  Filled 2022-01-28: qty 2

## 2022-01-28 NOTE — ED Provider Notes (Signed)
[facility] Provider Note    Event Date/Time   First MD Initiated Contact with Patient 01/28/22 0820     (approximate)   History   Abdominal Pain   HPI  Lisa Solis is a 21 y.o. female past medical history of ovarian cyst who presents with abdominal pain.  Patient notes that about 1 week ago he began having vomiting and diarrhea.  Was initially seen in the emergency department on  2/17 found to have mild leukocytosis of 13 and a positive UA, negative hCG and was diagnosed with gastroenteritis.  She initially was feeling better at home and pain resolved.  However then this morning around 5 AM she woke up with diffuse abdominal pain and recurrence of vomiting and diarrhea.  Has had multiple episodes of both.  Denies any blood in her vomit or stool.  Has been taking antibiotic for UTI.  Denies urinary symptoms.  Denies fevers.  Denies abdominal vaginal discharge.    Past Medical History:  Diagnosis Date   COVID-19 08/2020   Heart murmur    ages 10-13   Ovarian cyst     Patient Active Problem List   Diagnosis Date Noted   [redacted] weeks gestation of pregnancy 08/07/2019   Uterine rupture during labor 08/07/2019   Indication for care in labor and delivery, antepartum 08/06/2019   Premature rupture of membranes 08/06/2019   Poor weight gain of pregnancy, third trimester 06/02/2019   Chlamydia infection affecting pregnancy 03/30/2019   Supervision of normal first pregnancy, antepartum 01/16/2019     Physical Exam  Triage Vital Signs: ED Triage Vitals  Enc Vitals Group     BP 01/28/22 0804 131/84     Pulse Rate 01/28/22 0804 75     Resp 01/28/22 0804 16     Temp 01/28/22 0804 98 F (36.7 C)     Temp Source 01/28/22 0804 Oral     SpO2 01/28/22 0804 98 %     Weight --      Height --      Head Circumference --      Peak Flow --      Pain Score 01/28/22 0800 10     Pain Loc --      Pain Edu? --      Excl. in GC? --     Most recent vital  signs: Vitals:   01/28/22 0804  BP: 131/84  Pulse: 75  Resp: 16  Temp: 98 F (36.7 C)  SpO2: 98%     General: Awake, no distress.  CV:  Good peripheral perfusion.  Resp:  Normal effort.  Abd:  No distention.  Tenderness to palpation diffusely, worse in the epigastric region Neuro:             Awake, Alert, Oriented x 3  Other:     ED Results / Procedures / Treatments  Labs (all labs ordered are listed, but only abnormal results are displayed) Labs Reviewed  COMPREHENSIVE METABOLIC PANEL - Abnormal; Notable for the following components:      Result Value   CO2 20 (*)    Glucose, Bld 125 (*)    Total Bilirubin 0.2 (*)    All other components within normal limits  CBC WITH DIFFERENTIAL/PLATELET - Abnormal; Notable for the following components:   WBC 18.5 (*)    RBC 5.22 (*)    MCH 25.7 (*)    Neutro Abs 15.7 (*)    Abs Immature Granulocytes 0.09 (*)  All other components within normal limits  GASTROINTESTINAL PANEL BY PCR, STOOL (REPLACES STOOL CULTURE)  LIPASE, BLOOD  POC URINE PREG, ED     EKG     RADIOLOGY I reviewed the CT abdomen pelvis which shows a 4.2 cm ovarian cyst but no other acute intra-abdominal process   PROCEDURES:    MEDICATIONS ORDERED IN ED: Medications  ondansetron (ZOFRAN) injection 4 mg (4 mg Intravenous Given 01/28/22 0846)  ketorolac (TORADOL) 30 MG/ML injection 15 mg (15 mg Intravenous Given 01/28/22 0845)  sodium chloride 0.9 % bolus 1,000 mL (1,000 mLs Intravenous New Bag/Given 01/28/22 0845)  iohexol (OMNIPAQUE) 300 MG/ML solution 100 mL (100 mLs Intravenous Contrast Given 01/28/22 0908)     IMPRESSION / MDM / ASSESSMENT AND PLAN / ED COURSE  I reviewed the triage vital signs and the nursing notes.                              Differential diagnosis includes, but is not limited to, gastroenteritis, pancreatitis, bowel obstruction, ileus, colitis, appendicitis  Is a 21 year old female presenting with recurrent vomiting  diarrhea and diffuse abdominal pain.  She had symptoms about a week ago which initially resolved but then had recurrent symptoms starting around 5 AM today.  She describes the pain as diffuse and has had multiple episodes of nonbloody nonbilious vomiting and nonbloody stool.  Vital signs within normal limits.  On exam she appears well.  Does have discomfort when moving around in bed.  Abdomen is somewhat distended and she is mildly tender throughout but abdomen is soft.  Reviewed recent ED visit where she was found to have a mild leukocytosis discharged with Zofran.  Given the return of worsening symptoms will obtain a CT abdomen pelvis to rule out acute surgical pathology.  Will check labs and treat with Zofran and Toradol and fluid bolus.  Reviewed patient's labs which are notable for leukocytosis of 18.5, mildly low bicarb at 20, hCG is negative.  CT abdomen pelvis obtained which shows a 4.2 cm right-sided ovarian cyst but otherwise no acute abnormality.  Patient is not tender in the right do not think this is contributing to her presentation.  Has had an ovarian cyst on the right before but was not previously this large.  Suspect viral illness.  Patient tolerating p.o.  Was not able to produce a stool sample.  I did recommend she follow-up with GYN regarding the cyst.  She is stable for discharge.  Clinical Course as of 01/28/22 0951  Thu Jan 28, 2022  0832 Preg Test, Ur: NEGATIVE [KM]    Clinical Course User Index [KM] Rada Hay, MD     FINAL CLINICAL IMPRESSION(S) / ED DIAGNOSES   Final diagnoses:  Nausea vomiting and diarrhea  Cyst of right ovary     Rx / DC Orders   ED Discharge Orders     None        Note:  This document was prepared using Dragon voice recognition software and may include unintentional dictation errors.   Rada Hay, MD 01/28/22 [phone]

## 2022-01-28 NOTE — ED Triage Notes (Signed)
Pt via POV from home. Pt c/o generalized abd pain, vomiting, and diarrhea. Pt states she was seen and d/c on 2/17 with nausea medication. Meds helped but now the vomiting and diarrhea started again this AM. Pt is A&Ox4 and NAD

## 2022-01-28 NOTE — Telephone Encounter (Signed)
Transition Care Management Unsuccessful Follow-up Telephone Call  Date of discharge and from where:  01/22/2022 from [facility]  Attempts:  2nd Attempt  Reason for unsuccessful TCM follow-up call:  Unable to reach patient  Patient has returned to the ED.

## 2022-01-28 NOTE — Discharge Instructions (Addendum)
Your CAT scan did not show any abnormality in your bowels that would be causing your pain.  You likely have a viral illness.  Continue to take the Zofran as needed for vomiting.  Please just drink fluids and then introduce solids as you are feeling better.  You also have a cyst on your ovary which I do not think is contributing to your symptoms today but should be followed by a gynecologist.

## 2022-01-29 ENCOUNTER — Telehealth: Payer: Self-pay

## 2022-01-29 NOTE — Telephone Encounter (Signed)
Transition Care Management Follow-up Telephone Call Date of discharge and from where: 01/28/2022 from [facility] How have you been since you were released from the hospital? Patient stated that she is feeling okay. Patient stated that she did have some nausea this morning.  Any questions or concerns? No  Items Reviewed: Did the pt receive and understand the discharge instructions provided? Yes  Medications obtained and verified? Yes  Other? No  Any new allergies since your discharge? No  Dietary orders reviewed? No Do you have support at home? Yes   Functional Questionnaire: (I = Independent and D = Dependent) ADLs: I  Bathing/Dressing- I  Meal Prep- I  Eating- I  Maintaining continence- I  Transferring/Ambulation- I  Managing Meds- I   Follow up appointments reviewed:  PCP Hospital f/u appt confirmed? No  Patient is establishing with PCP in [facility] f/u appt confirmed? No  Patient is calling OBGYN this morning to schedule follow up.  Are transportation arrangements needed? No  If their condition worsens, is the pt aware to call PCP or go to the Emergency Dept.? Yes Was the patient provided with contact information for the PCP's office or ED? Yes Was to pt encouraged to call back with questions or concerns? Yes

## 2022-02-09 ENCOUNTER — Ambulatory Visit: Payer: Self-pay | Admitting: Family Medicine

## 2022-02-09 DIAGNOSIS — R1084 Generalized abdominal pain: Secondary | ICD-10-CM | POA: Diagnosis not present

## 2022-02-09 DIAGNOSIS — N83201 Unspecified ovarian cyst, right side: Secondary | ICD-10-CM | POA: Diagnosis not present

## 2022-02-09 DIAGNOSIS — R10811 Right upper quadrant abdominal tenderness: Secondary | ICD-10-CM | POA: Diagnosis not present

## 2022-02-09 DIAGNOSIS — R112 Nausea with vomiting, unspecified: Secondary | ICD-10-CM | POA: Diagnosis not present

## 2022-02-09 DIAGNOSIS — R1010 Upper abdominal pain, unspecified: Secondary | ICD-10-CM | POA: Diagnosis not present

## 2022-02-09 DIAGNOSIS — R197 Diarrhea, unspecified: Secondary | ICD-10-CM | POA: Diagnosis not present

## 2022-02-09 DIAGNOSIS — Z793 Long term (current) use of hormonal contraceptives: Secondary | ICD-10-CM | POA: Diagnosis not present

## 2022-02-09 DIAGNOSIS — R10816 Epigastric abdominal tenderness: Secondary | ICD-10-CM | POA: Diagnosis not present

## 2022-02-24 ENCOUNTER — Encounter: Payer: Self-pay | Admitting: Family Medicine

## 2022-02-24 ENCOUNTER — Other Ambulatory Visit: Payer: Self-pay

## 2022-02-24 ENCOUNTER — Ambulatory Visit (INDEPENDENT_AMBULATORY_CARE_PROVIDER_SITE_OTHER): Payer: Medicaid Other | Admitting: Family Medicine

## 2022-02-24 ENCOUNTER — Other Ambulatory Visit (HOSPITAL_COMMUNITY)
Admission: RE | Admit: 2022-02-24 | Discharge: 2022-02-24 | Disposition: A | Discharge status: Home / Self Care | Payer: Medicaid Other | Source: Ambulatory Visit | Attending: Family Medicine | Admitting: Family Medicine

## 2022-02-24 VITALS — BP 118/62 | Ht <= 58 in

## 2022-02-24 DIAGNOSIS — N83201 Unspecified ovarian cyst, right side: Secondary | ICD-10-CM | POA: Diagnosis not present

## 2022-02-24 DIAGNOSIS — Z113 Encounter for screening for infections with a predominantly sexual mode of transmission: Secondary | ICD-10-CM | POA: Insufficient documentation

## 2022-02-24 DIAGNOSIS — Z124 Encounter for screening for malignant neoplasm of cervix: Secondary | ICD-10-CM | POA: Insufficient documentation

## 2022-02-24 NOTE — Progress Notes (Signed)
? ?  GYNECOLOGY PROBLEM  VISIT ENCOUNTER NOTE ? ?Subjective:  ? Lisa Solis [place] is a 21 y.o. G21P1001 female here for a problem GYN visit.  Current complaints: Right ovarian cyst. She reports a history of this but is was smaller. Recently seen at ER for Abdominal pain, related to like gastro. On CT it was noted she had a 4.2 CM cyst on the right ovary. I personally reviewed the the CT images in Epic and agree with right ovarian cyst appears simple but needs [place].    ? ?Denies abnormal vaginal bleeding, discharge, pelvic pain, problems with intercourse or other gynecologic concerns.  ?  ?Gynecologic History ?No LMP recorded. Patient has had an implant. ? ?Contraception: Nexplanon ? ?Health Maintenance Due  ?Topic Date Due  ? COVID-19 Vaccine (1) Never done  ? HPV VACCINES (1 - 2-dose series) Never done  ? Hepatitis C Screening  Never done  ? PAP-Cervical Cytology Screening  Never done  ? PAP SMEAR-Modifier  Never done  ? CHLAMYDIA SCREENING  09/19/2020  ? INFLUENZA VACCINE  07/06/2021  ? ? ?The following portions of the patient's history were reviewed and updated as appropriate: allergies, current medications, past family history, past medical history, past social history, past surgical history and problem list. ? ?Review of Systems ?Pertinent items are noted in HPI. ?  ?Objective:  ?BP 118/62   Ht 4\' 9"  (1.448 m)   BMI 27.05 kg/m?  ?Gen: well appearing, NAD ?HEENT: no scleral icterus ?CV: RR ?Lung: Normal WOB ?Ext: warm well perfused ?Abd: well healed lower abdominal incision. ? ?PELVIC: Normal appearing external genitalia; normal appearing vaginal mucosa and cervix.  No abnormal discharge noted.  Pap smear obtained.  Normal uterine size, no other palpable masses, no uterine or adnexal tenderness. ? ? ?Assessment and Plan:  ? ? ?1. Right ovarian cyst ?- needs further characterization with ?- Discussed plan to see cyst and better characterize and follow in 8-10 weeks to assure resolution. If similar size and not  resolved surgical management may be needed ?-10-10 PELVIS (TRANSABDOMINAL ONLY); Future ? ?2. Cervical cancer screening ?- Cytology - PAP ? ?3. Screening examination for venereal disease ?- Cytology - PAP ? ?4. Uterine rupture during labor ?Updated problem list to reflect this rare history ? ?Please refer to After Visit Summary for other counseling recommendations.  ? ?Return if symptoms worsen or fail to improve. ? ?[place], MD, MPH, ABFM ?Attending Physician ?Faculty [facility]- [facility] ? ?

## 2022-03-02 LAB — CYTOLOGY - PAP
Chlamydia: NEGATIVE
Comment: NEGATIVE
Comment: NEGATIVE
Comment: NORMAL
Diagnosis: NEGATIVE
Neisseria Gonorrhea: NEGATIVE
Trichomonas: NEGATIVE

## 2022-03-11 ENCOUNTER — Ambulatory Visit (INDEPENDENT_AMBULATORY_CARE_PROVIDER_SITE_OTHER): Payer: Medicaid Other

## 2022-03-11 DIAGNOSIS — N83201 Unspecified ovarian cyst, right side: Secondary | ICD-10-CM | POA: Diagnosis not present

## 2022-06-21 ENCOUNTER — Telehealth: Payer: Self-pay

## 2022-06-21 DIAGNOSIS — Z832 Family history of diseases of the blood and blood-forming organs and certain disorders involving the immune mechanism: Secondary | ICD-10-CM

## 2022-06-21 NOTE — Telephone Encounter (Signed)
Lisa Solis called triage line requesting to have some lab work done to see if she has the Von Johnathan Hausen Disease stated her mother has this.

## 2022-06-24 NOTE — Telephone Encounter (Signed)
Patient is scheduled for 06/25/22 at 2:20 pm

## 2022-06-24 NOTE — Telephone Encounter (Signed)
Can you call pt and schedule an lab appointment for tomorrow?

## 2022-06-25 ENCOUNTER — Other Ambulatory Visit: Payer: Medicaid Other

## 2022-06-25 DIAGNOSIS — Z832 Family history of diseases of the blood and blood-forming organs and certain disorders involving the immune mechanism: Secondary | ICD-10-CM

## 2022-06-26 LAB — APTT: aPTT: 29 s (ref 24–33)

## 2022-06-26 LAB — PROTIME-INR
INR: 0.9 (ref 0.9–1.2)
Prothrombin Time: 9.6 s (ref 9.1–12.0)

## 2022-06-28 LAB — VON WILLEBRAND PANEL
Factor VIII Activity: 140 % (ref 56–140)
Von Willebrand Ag: 154 % (ref 50–200)
Von Willebrand Factor: 131 % (ref 50–200)

## 2022-06-28 LAB — COAG STUDIES INTERP REPORT

## 2022-09-16 ENCOUNTER — Ambulatory Visit (INDEPENDENT_AMBULATORY_CARE_PROVIDER_SITE_OTHER): Payer: Medicaid Other | Admitting: Obstetrics

## 2022-09-16 VITALS — BP 124/76 | HR 70 | Wt 147.0 lb

## 2022-09-16 DIAGNOSIS — Z975 Presence of (intrauterine) contraceptive device: Secondary | ICD-10-CM

## 2022-09-16 DIAGNOSIS — Z3046 Encounter for surveillance of implantable subdermal contraceptive: Secondary | ICD-10-CM

## 2022-09-16 HISTORY — DX: Presence of (intrauterine) contraceptive device: Z97.5

## 2022-09-16 NOTE — Progress Notes (Signed)
GYNECOLOGY PROCEDURE NOTE    Lisa Solis presents today for removal and reinsertion of Nexplanon. She is happy with this method and so wants to continue with another implant. Her present Nexplanon is placed further back on her arm, and so she will have it removed and then the new implant placed in the Company advised insertion site.  Nexplanon removal discussed in detail.  Risks of infection, bleeding, nerve injury all reviewed.  Patient understands risks and desires to proceed.  Verbal consent obtained.  Patient is certain she wants the Nexplanon removed.  All questions answered.  Procedure: Patient placed in dorsal supine with left arm above head, elbow flexed at 90 degrees, arm resting on examination table.  Nexplanon identified without problems.  Betadine scrub x3.  1 ml of 1% lidocaine injected under Nexplanondevice without problems.  Sterile gloves applied.  Small 0.5cm incision made at distal tip of Nexplanon device with 11 blade scalpel.  Nexplanon brought to incision and grasped with a small kelly clamp.  Nexplanon removed intact without problems.  Pressure applied to incision.  Hemostasis obtained.  Steri-strips applied, followed by bandage and compression dressing.  Patient tolerated procedure well.  No complications.   Assessment: 21 y.o. year old female now s/p uncomplicated Nexplanon removal.  Plan: 1.  Patient given post procedure precautions and asked to call for fever, chills, redness or drainage from her incision, bleeding from incision.  She understands she will likely have a small bruise near site of removal and can remove bandage tomorrow and steri-strips in approximately 1 week.  2) Contraception - We will proceed with insertion of a new Nexplanon.    Patient is a 21 y.o. G1P1001 presenting for Nexplanon insertion as her desires means of contraception.  She provided informed consent, signed copy in the chart, time out was performed. Pregnancy test was negative, with self  reported LMP of Patient's last menstrual period was 08/23/2022 (approximate).  She understands that Nexplanon is a progesterone only therapy, and that patients often patients have irregular and unpredictable vaginal bleeding or amenorrhea. She understands that other side effects are possible related to systemic progesterone, including but not limited to, headaches, breast tenderness, nausea, and irritability. While effective at preventing pregnancy long acting reversible contraceptives do not prevent transmission of sexually transmitted diseases and use of barrier methods for this purpose was discussed. The placement procedure for Nexplanon was reviewed with the patient in detail including risks of nerve injury, infection, bleeding and injury to other muscles or tendons. She understands that the Nexplanon implant is good for 3 years and needs to be removed at the end of that time.  She understands that Nexplanon is an extremely effective option for contraception, with failure rate of <1%. This information is reviewed today and all questions were answered. Informed consent was obtained, both verbally and written.   The patient is healthy and has no contraindications to Implanon use. Urine pregnancy test was performed today and was negative.  Procedure Appropriate time out taken.  Patient placed in dorsal supine with her left arm above head, elbow flexed at 90 degrees, arm resting on examination table.  The bicipital grove was palpated and site 8-10cm proximal to the medial epicondyle was indentified . The insertion site was prepped with a two betadine swabs and then injected with 2 cc of 1% lidocaine without epinephrine.  Nexplanon removed form sterile blister packaging,  Device confirmed in needle, before inserting full length of needle, tenting up the skin as the needle was advance.  The drug eluting rod was then deployed by pulling back the slider per the manufactures recommendation.  The implant was  palpable by the clinician as well as the patient.  The insertion site covered dressed with a band aid before applying  a kerlex bandage pressure dressing..Minimal blood loss was noted during the procedure.  The patientt tolerated the procedure well.   She was instructed to wear the bandage for 24 hours, call with any signs of infection.  She was given the Implanon card and instructed to have the rod removed in 3 years.  She will RTC for any problems with this implant PRN .  Charge [phone] for nexplanon device, CPT [id number] for procedure J2001 for lidocaine administration   Lisa Solis, CNM  09/16/2022 4:55 PM

## 2022-09-17 ENCOUNTER — Encounter: Payer: Self-pay | Admitting: Obstetrics

## 2022-10-02 IMAGING — CT CT ABD-PELV W/ CM
2 of 4 series · 16 of 46 positions shown, 18 images · IV contrast (APPLIED)
Comparison: None.

CLINICAL DATA: Abdominal pain, vomiting, and diarrhea for
approximately 1 week.

EXAM:
CT ABDOMEN AND PELVIS WITH CONTRAST
TECHNIQUE: Multidetector CT imaging of the abdomen and pelvis was performed
using the standard protocol following bolus administration of
intravenous contrast.

[Series 2: abdomen 5.0 · axial · 0.66mm/px · z∈[-869,-484]mm · 13 of 89 slices shown, 15 images]
[im 6/89  soft-tissue]
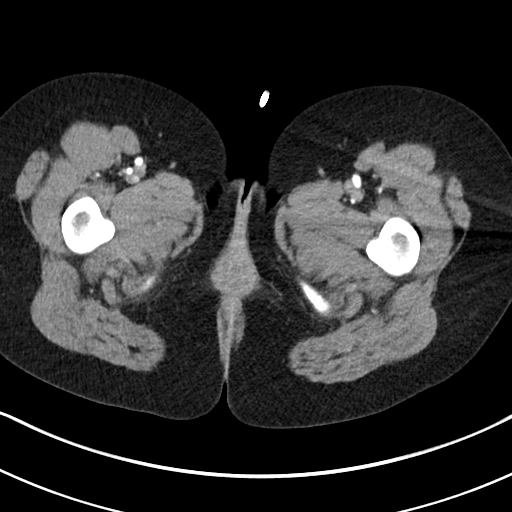
[im 6/89  bone]
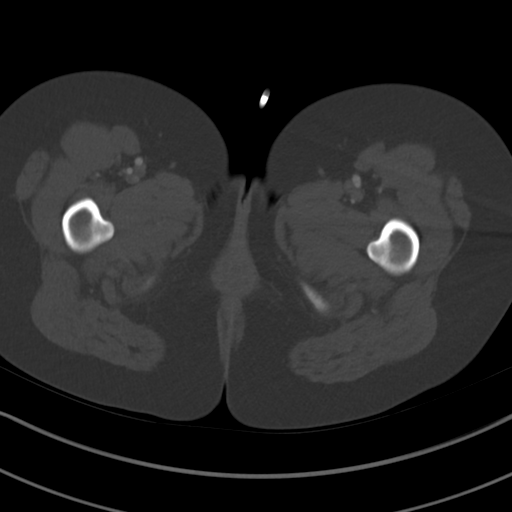
[im 12/89  soft-tissue]
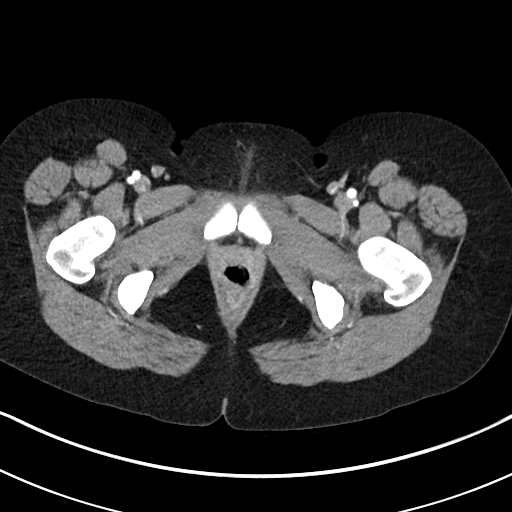
[im 17/89  soft-tissue]
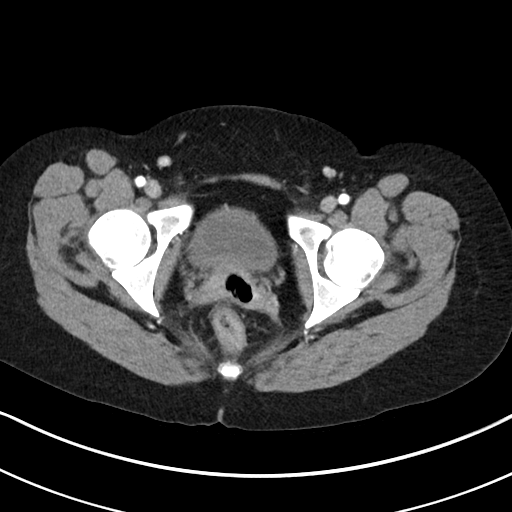
[im 28/89  soft-tissue]
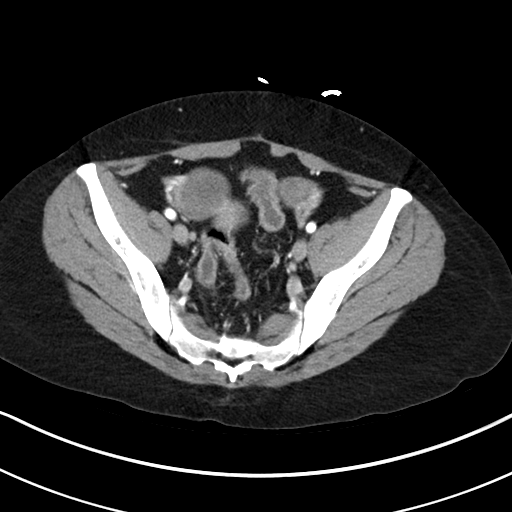
[im 34/89  soft-tissue]
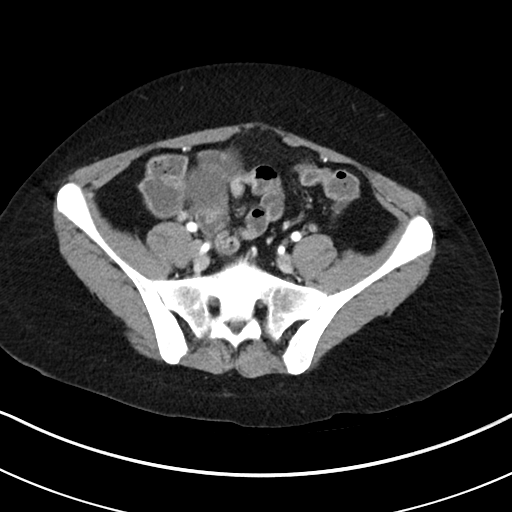
[im 39/89  soft-tissue]
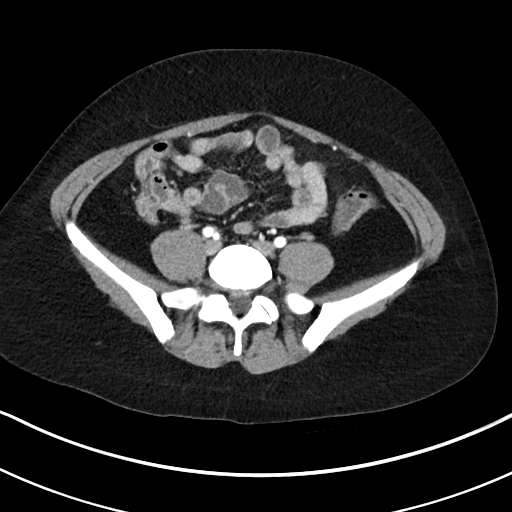
[im 45/89  soft-tissue]
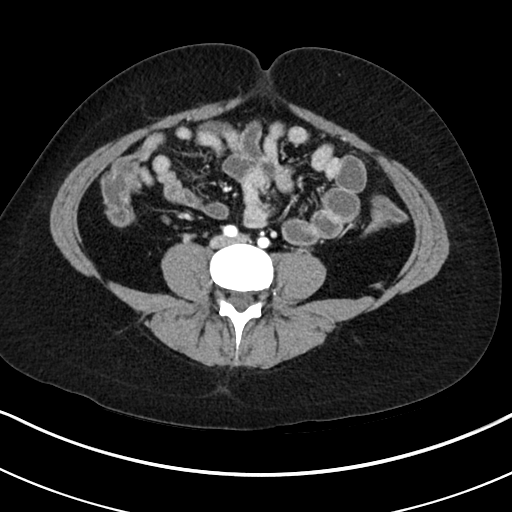
[im 50/89  soft-tissue]
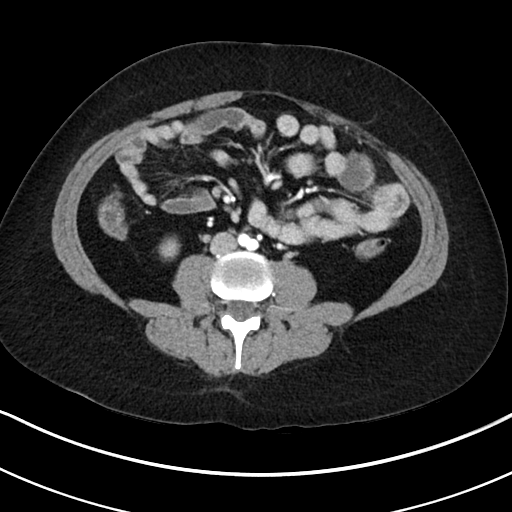
[im 56/89  soft-tissue]
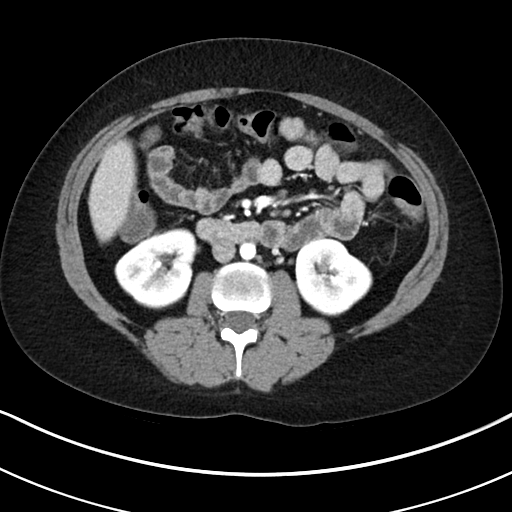
[im 56/89  bone]
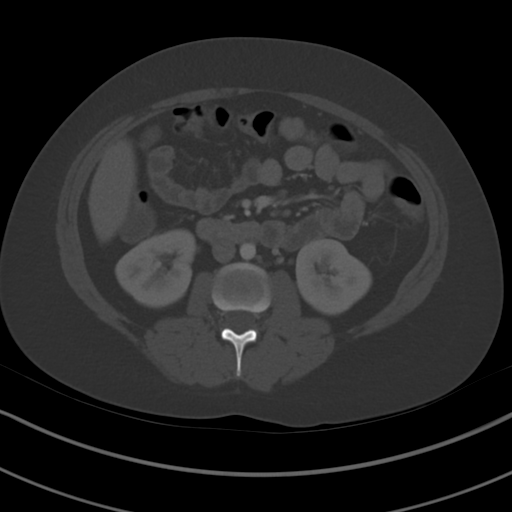
[im 61/89  soft-tissue]
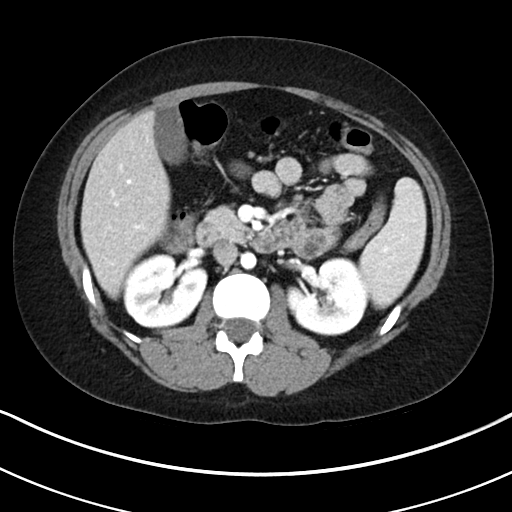
[im 72/89  soft-tissue]
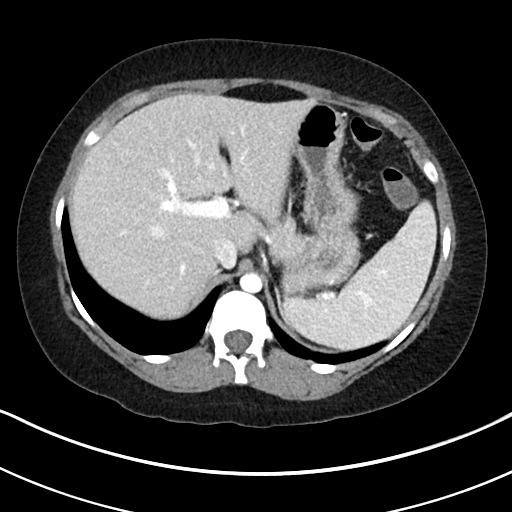
[im 78/89  soft-tissue]
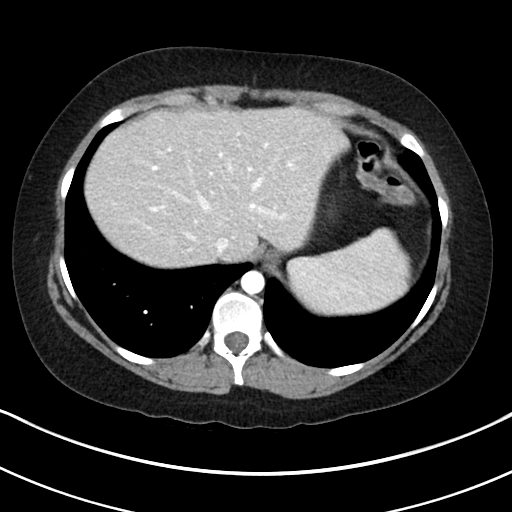
[im 83/89  soft-tissue]
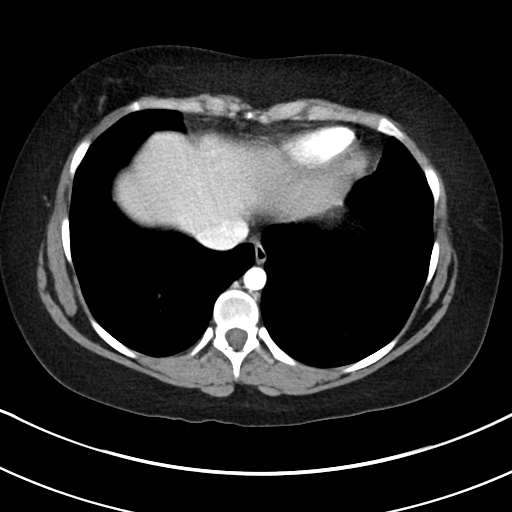

[Series 5: abdomen 3.0 mpr cor · coronal · 0.69mm/px · 3 of 89 slices shown]
[im 30/89  soft-tissue]
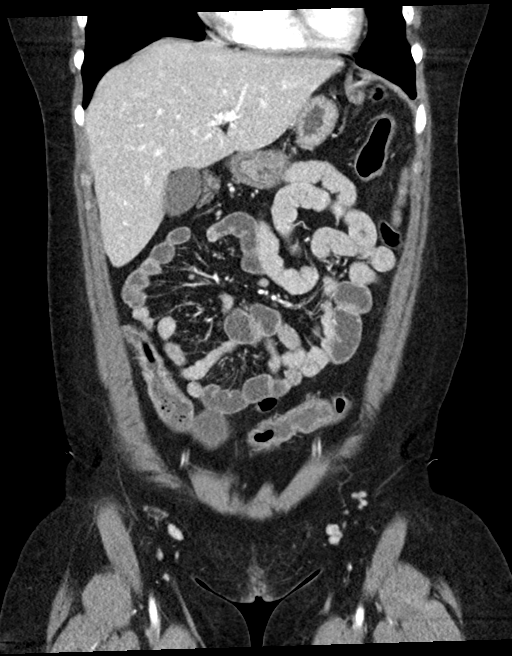
[im 40/89  soft-tissue]
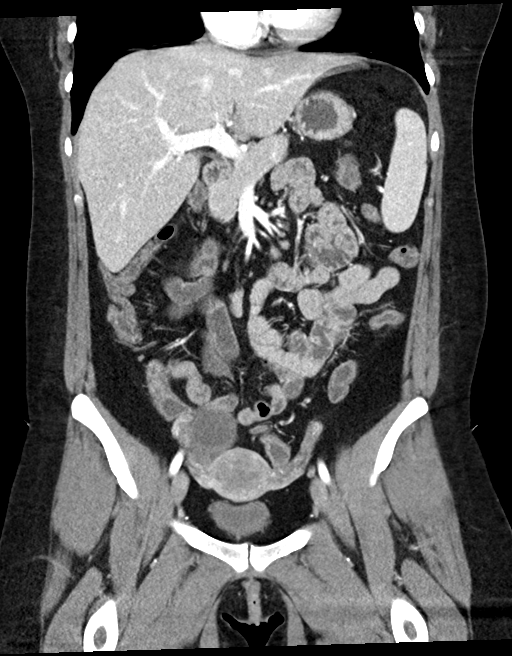
[im 49/89  soft-tissue]
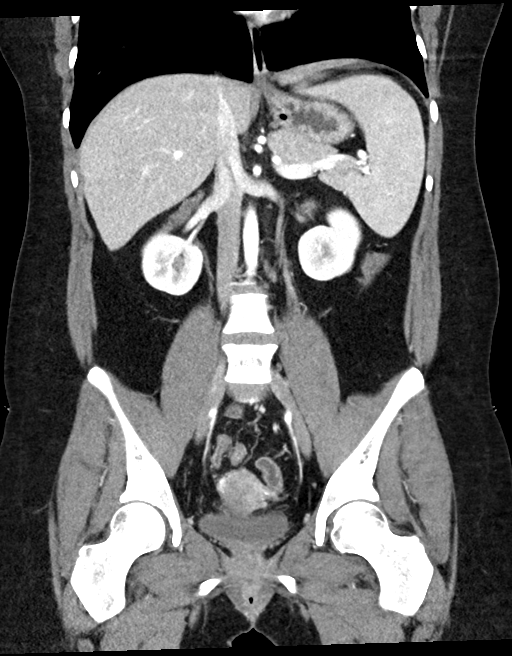

[16 of 46 positions shown; findings below may reference images not displayed]

RADIATION DOSE REDUCTION: This exam was performed according to the
departmental dose-optimization program which includes automated
exposure control, adjustment of the mA and/or kV according to
patient size and/or use of iterative reconstruction technique.

CONTRAST:  100mL OMNIPAQUE IOHEXOL 300 MG/ML  SOLN
FINDINGS: Lower Chest: No acute findings.

Hepatobiliary: No hepatic masses identified. Gallbladder is
unremarkable. No evidence of biliary ductal dilatation.

Pancreas:  No mass or inflammatory changes.

Spleen: Within normal limits in size and appearance.

Adrenals/Urinary Tract: No masses identified. No evidence of
ureteral calculi or hydronephrosis.

Stomach/Bowel: No evidence of obstruction, inflammatory process or
abnormal fluid collections. Normal appendix visualized.

Vascular/Lymphatic: No pathologically enlarged lymph nodes. No acute
vascular findings.

Reproductive: Normal appearance of uterus. Simple appearing right
ovarian cyst is seen which measures 4.2 x 4.0 cm. No other pelvic
masses identified. No evidence of inflammatory process or abnormal
fluid collections.

Other:  None.

Musculoskeletal:  No suspicious bone lesions identified.
IMPRESSION: 4.2 cm benign-appearing right ovarian cyst, most likely physiologic
in a reproductive age female. No follow-up imaging recommended.
Note: This recommendation does not apply to premenarchal patients
and to those with increased risk (genetic, family history, elevated
tumor markers or other high-risk factors) of ovarian cancer.
Reference: JACR [DATE]):[id number]

## 2022-10-25 DIAGNOSIS — M25562 Pain in left knee: Secondary | ICD-10-CM | POA: Diagnosis not present

## 2022-11-03 DIAGNOSIS — Z3046 Encounter for surveillance of implantable subdermal contraceptive: Secondary | ICD-10-CM | POA: Diagnosis not present

## 2022-11-03 MED ORDER — ETONOGESTREL 68 MG ~~LOC~~ IMPL
68.0000 mg | DRUG_IMPLANT | Freq: Once | SUBCUTANEOUS | Status: AC
  Administered 2022-11-03: 68 mg via SUBCUTANEOUS

## 2022-11-03 NOTE — Addendum Note (Signed)
Addended by: Liliane Shi on: 11/03/2022 10:18 AM   Modules accepted: Orders

## 2023-06-15 ENCOUNTER — Ambulatory Visit: Payer: Medicaid Other

## 2023-06-15 VITALS — BP 122/74 | Ht <= 58 in | Wt 149.0 lb

## 2023-06-15 DIAGNOSIS — Z01419 Encounter for gynecological examination (general) (routine) without abnormal findings: Secondary | ICD-10-CM | POA: Diagnosis not present

## 2023-06-15 NOTE — Assessment & Plan Note (Addendum)
-   Reviewed health maintenance topics documented below. - Pap smear 02/2022, NILM. Due for next pap smear in 2026. - Nexplanon inserted October 2023, happy with this method of contraception. - In a monogamous relationship for over 5 years, declines STI testing today.

## 2023-06-15 NOTE — Progress Notes (Signed)
Outpatient Gynecology Note: Annual Visit  Assessment/Plan:    Lisa Solis is a 22 y.o. female G1P1001 with normal well-woman gynecologic exam.   Well woman exam - Reviewed health maintenance topics documented below. - Pap smear 02/2022, NILM. Due for next pap smear in 2026. - Nexplanon inserted October 2023, happy with this method of contraception. - In a monogamous relationship for over 5 years, declines STI testing today.    Risk factors identified in Subjective to review: none No orders of the defined types were placed in this encounter.  Current Outpatient Medications  Medication Instructions   etonogestrel (NEXPLANON) 68 MG IMPL implant 1 each, Subdermal,  Once    Return in about 1 year (around 06/14/2024) for Annual physical.    Subjective:    Lisa Solis is a 22 y.o. female G1P1001 who presents for annual wellness visit.   Occupation works at [employer] with boyfriend and child.    CONCERNS? no  Well Woman Visit:  GYN HISTORY:  No LMP recorded. Patient has had an implant.     Menstrual History: OB History     Gravida  1   Para  1   Term  1   Preterm  0   AB  0   Living  1      SAB  0   IAB  0   Ectopic  0   Multiple  0   Live Births  1           Menarche age: 78 No LMP recorded. Patient has had an implant.   Intermenstrual bleeding, spotting, or discharge? No bleeding, but intermittent cramping Urinary incontinence? no  Sexually active: yes Number of sexual partners: single for 5.5 years Gender of sexual Partners: male Dyspareunia? no Last pap: 2023, NILM History of abnormal Pap: no Gardasil series:  thinks so  STI history: chlamydia, in 2019 Contraceptive methods:  Nexplanon .  Health Maintenance > Reviewed breast self-awareness, First degree relative with hx of breast or ovarian cancer? No > History of abnormal mammogram: No > Exercise:  yoga and Zumba at work , moderately active > Dietary Supplements:  Folate: No;  Calcium: No}; Vitamin D: No > Body mass index is 31.68 kg/m.  > Recent dental visit Yes.   > Seat Belt Use: Yes.   > Texting and driving? No. > Guns in the house No. > STI/HIV testing or immunizations needed? No. > Concern for alcohol abuse? 0   Tobacco or other drug use: vapes Tobacco Use: Medium Risk (06/15/2023)   Patient History    Smoking Tobacco Use: Former    Smokeless Tobacco Use: Never    Passive Exposure: Not on file     PHQ-2 Score: In last two weeks, how often have you felt: Little interest or pleasure in doing things: Several days (+1) Feeling down, depressed or hopeless: Not at all (0) Score: 1  GAD-2 Over the last 2 weeks, how often have you been bothered by the following problems? Feeling nervous, anxious or on edge: More than half the days (+2) Not being able to stop or control worrying: Not at all (0)} Score: 2  _________________________________________________________  Current Outpatient Medications  Medication Sig Dispense Refill   etonogestrel (NEXPLANON) 68 MG IMPL implant 1 each by Subdermal route once.     No current facility-administered medications for this visit.   No Known Allergies  Past Medical History:  Diagnosis Date   COVID-19 08/2020  Heart murmur    ages 10-13   Ovarian cyst    Past Surgical History:  Procedure Laterality Date   CESAREAN SECTION N/A 08/07/2019   Procedure: CESAREAN SECTION;  Surgeon: Conard Novak, MD;  Location: [facility] ORS;  Service: Obstetrics;  Laterality: N/A;   CYST REMOVAL NECK     OB History     Gravida  1   Para  1   Term  1   Preterm  0   AB  0   Living  1      SAB  0   IAB  0   Ectopic  0   Multiple  0   Live Births  1          Social History   Tobacco Use   Smoking status: Former    Types: E-cigarettes   Smokeless tobacco: Never  Substance Use Topics   Alcohol use: No   Social History   Substance and Sexual Activity  Sexual Activity Yes   Birth  control/protection: Implant    Immunization History  Administered Date(s) Administered   Influenza Inj Mdck Quad With Preservative 12/29/2017   Influenza,inj,Quad PF,6+ Mos 07/30/2019   Tdap 05/31/2019     Review Of Systems  Constitutional: Denied constitutional symptoms, night sweats, recent illness, fatigue, fever, insomnia and weight loss.  Eyes: Denied eye symptoms, eye pain, photophobia, vision change and visual disturbance.  Ears/Nose/Throat/Neck: Denied ear, nose, throat or neck symptoms, hearing loss, nasal discharge, sinus congestion and sore throat.  Cardiovascular: Denied cardiovascular symptoms, arrhythmia, chest pain/pressure, edema, exercise intolerance, orthopnea and palpitations.  Respiratory: Denied pulmonary symptoms, asthma, pleuritic pain, productive sputum, cough, dyspnea and wheezing.  Gastrointestinal: Denied, gastro-esophageal reflux, melena, nausea and vomiting.  Genitourinary: Denied genitourinary symptoms including symptomatic vaginal discharge, pelvic relaxation issues, and urinary complaints.  Musculoskeletal: Denied musculoskeletal symptoms, stiffness, swelling, muscle weakness and myalgia.  Dermatologic: Denied dermatology symptoms, rash and scar.  Neurologic: Denied neurology symptoms, dizziness, headache, neck pain and syncope.  Psychiatric: Denied psychiatric symptoms, anxiety and depression.  Endocrine: Denied endocrine symptoms including hot flashes and night sweats.      Objective:    BP 122/74   Ht 4' 9.5" (1.461 m)   Wt 149 lb (67.6 kg)   BMI 31.68 kg/m   Constitutional: Well-developed, well-nourished female in no acute distress Neurological: Alert and oriented to person, place, and time Psychiatric: Mood and affect appropriate Skin: No rashes or lesions. Nexplanon implant palpated in upper left arm. Neck: Supple without masses. Trachea is midline.Thyroid is normal size without masses Lymphatics: No cervical, axillary, supraclavicular, or  inguinal adenopathy noted Respiratory: Clear to auscultation bilaterally. Good air movement with normal work of breathing. Cardiovascular: Regular rate and rhythm. Extremities grossly normal, nontender with no edema; pulses regular Gastrointestinal: Soft, nontender, nondistended. No masses or hernias appreciated. No hepatosplenomegaly. No fluid wave. No rebound or guarding. Breast Exam:  deferred with shared decision making Genitourinary:  deferred with shared decision making    Rectal: deferred    Autumn Messing, CNM  06/15/23 1:40 PM

## 2023-08-19 ENCOUNTER — Encounter: Payer: Self-pay | Admitting: Family Medicine

## 2023-08-19 ENCOUNTER — Ambulatory Visit (INDEPENDENT_AMBULATORY_CARE_PROVIDER_SITE_OTHER): Payer: Medicaid Other | Admitting: Family Medicine

## 2023-08-19 ENCOUNTER — Other Ambulatory Visit: Payer: Self-pay | Admitting: Family Medicine

## 2023-08-19 ENCOUNTER — Ambulatory Visit: Payer: Medicaid Other | Admitting: Family Medicine

## 2023-08-19 VITALS — BP 114/72 | HR 65 | Temp 98.2°F | Ht <= 58 in | Wt 149.5 lb

## 2023-08-19 DIAGNOSIS — Z832 Family history of diseases of the blood and blood-forming organs and certain disorders involving the immune mechanism: Secondary | ICD-10-CM

## 2023-08-19 DIAGNOSIS — Z113 Encounter for screening for infections with a predominantly sexual mode of transmission: Secondary | ICD-10-CM

## 2023-08-19 DIAGNOSIS — Z0000 Encounter for general adult medical examination without abnormal findings: Secondary | ICD-10-CM

## 2023-08-19 DIAGNOSIS — Z6832 Body mass index (BMI) 32.0-32.9, adult: Secondary | ICD-10-CM | POA: Diagnosis not present

## 2023-08-19 DIAGNOSIS — Z0001 Encounter for general adult medical examination with abnormal findings: Secondary | ICD-10-CM

## 2023-08-19 DIAGNOSIS — Z23 Encounter for immunization: Secondary | ICD-10-CM | POA: Diagnosis not present

## 2023-08-19 DIAGNOSIS — E6609 Other obesity due to excess calories: Secondary | ICD-10-CM

## 2023-08-19 DIAGNOSIS — G44229 Chronic tension-type headache, not intractable: Secondary | ICD-10-CM | POA: Diagnosis not present

## 2023-08-19 DIAGNOSIS — Z8249 Family history of ischemic heart disease and other diseases of the circulatory system: Secondary | ICD-10-CM | POA: Diagnosis not present

## 2023-08-19 MED ORDER — SEMAGLUTIDE-WEIGHT MANAGEMENT 0.25 MG/0.5ML ~~LOC~~ SOAJ: 0.2500 mg | SUBCUTANEOUS | 0 refills | Status: DC

## 2023-08-19 MED ORDER — BUPROPION HCL ER (XL) 150 MG PO TB24: 150.0000 mg | ORAL_TABLET | Freq: Every day | ORAL | 0 refills | Status: DC

## 2023-08-19 NOTE — Progress Notes (Unsigned)
New patient visit   Patient: Lisa Solis [place]   DOB: [birth year]   22 y.o. Female  MRN: [id number] Visit Date: 08/19/2023  Today's healthcare provider: Jacky Kindle, FNP  Patient presents for new patient visit to establish care.  Introduced to Publishing rights manager role and practice setting.  All questions answered.  Discussed provider/patient relationship and expectations.  Subjective    Lisa Solis [place] is a 22 y.o. female who presents today as a new patient to establish care.   HPI   Past Medical History:  Diagnosis Date  . Blood transfusion without reported diagnosis   . COVID-19 08/2020  . Heart murmur    ages 64-13  . Ovarian cyst    Past Surgical History:  Procedure Laterality Date  . CESAREAN SECTION N/A 08/07/2019   Procedure: CESAREAN SECTION;  Surgeon: Conard Novak, MD;  Location: [facility] ORS;  Service: Obstetrics;  Laterality: N/A;  . CYST REMOVAL NECK     Family Status  Relation Name Status  . Mother  Alive  . Brother  (Not Specified)  . MGM  (Not Specified)  No partnership data on file   Family History  Problem Relation Age of Onset  . Hypertension Mother   . Heart attack Mother   . Cerebral palsy Brother   . Hypertension Maternal Grandmother    Social History   Socioeconomic History  . Marital status: Single    Spouse name: Not on file  . Number of children: Not on file  . Years of education: Not on file  . Highest education level: Not on file  Occupational History  . Not on file  Tobacco Use  . Smoking status: Every Day    Types: E-cigarettes  . Smokeless tobacco: Never  Vaping Use  . Vaping status: Every Day  Substance and Sexual Activity  . Alcohol use: Yes    Alcohol/week: 2.0 standard drinks of alcohol    Types: 2 Glasses of wine per week  . Drug use: Never  . Sexual activity: Yes    Birth control/protection: Implant  Other Topics Concern  . Not on file  Social History Narrative  . Not on file   Social Determinants of Health    Financial Resource Strain: Not on file  Food Insecurity: Not on file  Transportation Needs: No Transportation Needs (08/07/2019)   PRAPARE - Transportation   . Lack of Transportation (Medical): No   . Lack of Transportation (Non-Medical): No  Physical Activity: Not on file  Stress: Not on file  Social Connections: Not on file   Outpatient Medications Prior to Visit  Medication Sig  . etonogestrel (NEXPLANON) 68 MG IMPL implant 1 each by Subdermal route once.   No facility-administered medications prior to visit.   No Known Allergies  Immunization History  Administered Date(s) Administered  . Influenza Inj Mdck Quad With Preservative 12/29/2017  . Influenza,inj,Quad PF,6+ Mos 07/30/2019  . Tdap 05/31/2019    Health Maintenance  Topic Date Due  . HPV VACCINES (1 - 3-dose series) Never done  . Hepatitis C Screening  Never done  . CHLAMYDIA SCREENING  02/25/2023  . COVID-19 Vaccine (1 - 2023-24 season) Never done  . INFLUENZA VACCINE  03/05/2024 (Originally 07/07/2023)  . PAP-Cervical Cytology Screening  02/24/2025  . PAP SMEAR-Modifier  02/24/2025  . DTaP/Tdap/Td (2 - Td or Tdap) 05/30/2029  . HIV Screening  Completed    Patient Care Team: Jacky Kindle, FNP as PCP - General (Family Medicine)  Review  of Systems  Objective    BP 114/72 (BP Location: Right Arm, Patient Position: Sitting, Cuff Size: Normal)   Pulse 65   Temp 98.2 F (36.8 C) (Oral)   Ht 4\' 9"  (1.448 m)   Wt 149 lb 8 oz (67.8 kg)   SpO2 98%   BMI 32.35 kg/m   Physical Exam  Depression Screen    08/19/2023    3:05 PM  PHQ 2/9 Scores  PHQ - 2 Score 0  PHQ- 9 Score 6   No results found for any visits on 08/19/23.  Assessment & Plan      Problem List Items Addressed This Visit   None Visit Diagnoses     Family history of essential hypertension    -  Primary   Chronic tension-type headache, not intractable       Family history of von Willebrand disease       Class 1 obesity due to excess  calories without serious comorbidity with body mass index (BMI) of 32.0 to 32.9 in adult       Family history of early CAD       Screening examination for STI          No follow-ups on file.    Leilani Merl, FNP, have reviewed all documentation for this visit. The documentation on 08/19/23 for the exam, diagnosis, procedures, and orders are all accurate and complete.  Jacky Kindle, FNP  [facility] [phone] (phone) [phone] (fax)  [facility]

## 2023-08-20 ENCOUNTER — Other Ambulatory Visit: Payer: Self-pay | Admitting: Family Medicine

## 2023-08-20 MED ORDER — ROSUVASTATIN CALCIUM 10 MG PO TABS: 10.0000 mg | ORAL_TABLET | Freq: Every day | ORAL | 3 refills | Status: DC

## 2023-08-21 ENCOUNTER — Encounter: Payer: Self-pay | Admitting: Family Medicine

## 2023-08-21 DIAGNOSIS — Z0000 Encounter for general adult medical examination without abnormal findings: Secondary | ICD-10-CM | POA: Insufficient documentation

## 2023-08-21 DIAGNOSIS — Z113 Encounter for screening for infections with a predominantly sexual mode of transmission: Secondary | ICD-10-CM | POA: Insufficient documentation

## 2023-08-21 DIAGNOSIS — G44229 Chronic tension-type headache, not intractable: Secondary | ICD-10-CM | POA: Insufficient documentation

## 2023-08-21 DIAGNOSIS — E6609 Other obesity due to excess calories: Secondary | ICD-10-CM | POA: Insufficient documentation

## 2023-08-21 DIAGNOSIS — Z832 Family history of diseases of the blood and blood-forming organs and certain disorders involving the immune mechanism: Secondary | ICD-10-CM | POA: Insufficient documentation

## 2023-08-21 DIAGNOSIS — Z8249 Family history of ischemic heart disease and other diseases of the circulatory system: Secondary | ICD-10-CM | POA: Insufficient documentation

## 2023-08-21 NOTE — Assessment & Plan Note (Signed)

## 2023-08-21 NOTE — Assessment & Plan Note (Signed)
Chronic, stable Continue to monitor as episodes are linked to heat exposure and physical exercise with child Likely concern for poor PO intake/fluid imbalance

## 2023-08-21 NOTE — Assessment & Plan Note (Signed)
Body mass index is 32.35 kg/m. Discussed importance of healthy weight management Discussed diet and exercise Trial of wellbutrin to assist with wegovy start

## 2023-08-21 NOTE — Assessment & Plan Note (Signed)
Recommend LP recommend diet low in saturated fat and regular exercise - 30 min at least 5 times per week

## 2023-08-22 ENCOUNTER — Telehealth: Payer: Self-pay | Admitting: Family Medicine

## 2023-08-22 NOTE — Telephone Encounter (Signed)
Received a fax from covermymeds for [facility] 0.25mg /0.20ml  Key: [id number]

## 2023-08-24 ENCOUNTER — Telehealth: Payer: Self-pay | Admitting: Family Medicine

## 2023-08-24 LAB — STI PROFILE, CT/NG/TV
Chlamydia by NAA: NEGATIVE
Gonococcus by NAA: NEGATIVE
HCV Ab: NONREACTIVE
HIV Screen 4th Generation wRfx: NONREACTIVE
Hep B Core Total Ab: NEGATIVE
Hep B Surface Ab, Qual: REACTIVE
Hepatitis B Surface Ag: NEGATIVE
RPR Ser Ql: NONREACTIVE
Trich vag by NAA: NEGATIVE

## 2023-08-24 LAB — HCV INTERPRETATION

## 2023-08-24 NOTE — Telephone Encounter (Signed)
Patient called stated her insurance needs an prior auth for the Semaglutide-Weight Management 0.25 MG/0.5ML SOAJ. Please f/u with insurance.

## 2023-08-29 NOTE — Telephone Encounter (Signed)
PA initiated

## 2023-08-29 NOTE — Telephone Encounter (Signed)
Duplicate

## 2023-08-29 NOTE — Telephone Encounter (Signed)
Your prior authorization for Reginal Lutes has been approved! More Info Personalized support and financial assistance may be available through the [employer] program. For more information, and to see program requirements, click on the More Info button to the right.  Message from plan: Approved. Approved for WEGOVY Soln Auto-inj 0.25MG /0.5ML, quantity up to 2 per 28 days, under the pharmacy benefit. The drug has been approved from 08/29/2023 to 02/25/2024. Generic or biosimilar substitution may be required when available and preferred on the formulary. Please note that dispensing of non-maintenance and specialty medications may be limited to a monthly supply.. Authorization Expiration Date: February 25, 2024.

## 2023-08-30 ENCOUNTER — Encounter: Payer: Self-pay | Admitting: Family Medicine

## 2023-08-30 LAB — LIPID PANEL
Chol/HDL Ratio: 6.1 ratio — ABNORMAL HIGH (ref 0.0–4.4)
Cholesterol, Total: 232 mg/dL — ABNORMAL HIGH (ref 100–199)
HDL: 38 mg/dL — ABNORMAL LOW (ref >39)
LDL Chol Calc (NIH): 156 mg/dL — ABNORMAL HIGH (ref 0–99)
Triglycerides: 205 mg/dL — ABNORMAL HIGH (ref 0–149)
VLDL Cholesterol Cal: 38 mg/dL (ref 5–40)

## 2023-08-30 LAB — CBC WITH DIFFERENTIAL/PLATELET
Basophils Absolute: 0 10*3/uL (ref 0.0–0.2)
Basos: 0 %
EOS (ABSOLUTE): 0.1 10*3/uL (ref 0.0–0.4)
Eos: 1 %
Hematocrit: 38.8 % (ref 34.0–46.6)
Hemoglobin: 12.5 g/dL (ref 11.1–15.9)
Immature Grans (Abs): 0 10*3/uL (ref 0.0–0.1)
Immature Granulocytes: 0 %
Lymphocytes Absolute: 2.7 10*3/uL (ref 0.7–3.1)
Lymphs: 33 %
MCH: 26 pg — ABNORMAL LOW (ref 26.6–33.0)
MCHC: 32.2 g/dL (ref 31.5–35.7)
MCV: 81 fL (ref 79–97)
Monocytes Absolute: 0.5 10*3/uL (ref 0.1–0.9)
Monocytes: 6 %
Neutrophils Absolute: 4.9 10*3/uL (ref 1.4–7.0)
Neutrophils: 60 %
Platelets: 294 10*3/uL (ref 150–450)
RBC: 4.8 x10E6/uL (ref 3.77–5.28)
RDW: 12.7 % (ref 11.7–15.4)
WBC: 8.2 10*3/uL (ref 3.4–10.8)

## 2023-08-30 LAB — COMPREHENSIVE METABOLIC PANEL
ALT: 16 IU/L (ref 0–32)
AST: 17 IU/L (ref 0–40)
Albumin: 4.6 g/dL (ref 4.0–5.0)
Alkaline Phosphatase: 95 IU/L (ref 44–121)
BUN/Creatinine Ratio: 31 — ABNORMAL HIGH (ref 9–23)
BUN: 20 mg/dL (ref 6–20)
Bilirubin Total: <0.2 mg/dL (ref 0.0–1.2)
CO2: 20 mmol/L (ref 20–29)
Calcium: 9.8 mg/dL (ref 8.7–10.2)
Chloride: 106 mmol/L (ref 96–106)
Creatinine, Ser: 0.64 mg/dL (ref 0.57–1.00)
Globulin, Total: 2.3 g/dL (ref 1.5–4.5)
Glucose: 97 mg/dL (ref 70–99)
Potassium: 4 mmol/L (ref 3.5–5.2)
Sodium: 143 mmol/L (ref 134–144)
Total Protein: 6.9 g/dL (ref 6.0–8.5)
eGFR: 126 mL/min/{1.73_m2} (ref >59)

## 2023-08-30 LAB — HEPATITIS C ANTIBODY: Hep C Virus Ab: NONREACTIVE

## 2023-08-30 LAB — TSH: TSH: 2.13 u[IU]/mL (ref 0.450–4.500)

## 2023-08-30 LAB — VON WILLEBRAND ANTIGEN: Von Willebrand Ag: 106 % (ref 50–200)

## 2023-08-30 LAB — HIV ANTIBODY (ROUTINE TESTING W REFLEX): HIV Screen 4th Generation wRfx: NONREACTIVE

## 2023-08-30 LAB — HEMOGLOBIN A1C
Est. average glucose Bld gHb Est-mCnc: 120 mg/dL
Hgb A1c MFr Bld: 5.8 % — ABNORMAL HIGH (ref 4.8–5.6)

## 2023-08-30 LAB — FACTOR 8 RISTOCETIN COFACTOR: Von Willebrand Factor: 80 % (ref 50–200)

## 2023-08-30 LAB — VITAMIN D 25 HYDROXY (VIT D DEFICIENCY, FRACTURES): Vit D, 25-Hydroxy: 19.5 ng/mL — ABNORMAL LOW (ref 30.0–100.0)

## 2023-08-30 LAB — VON WILLEBRAND FACTOR MULTIMER

## 2023-08-30 LAB — B12 AND FOLATE PANEL
Folate: 7.9 ng/mL (ref >3.0)
Vitamin B-12: 290 pg/mL (ref 232–1245)

## 2023-09-30 DIAGNOSIS — M5459 Other low back pain: Secondary | ICD-10-CM | POA: Diagnosis not present

## 2023-10-07 ENCOUNTER — Ambulatory Visit (INDEPENDENT_AMBULATORY_CARE_PROVIDER_SITE_OTHER): Payer: Medicaid Other | Admitting: Family Medicine

## 2023-10-07 ENCOUNTER — Ambulatory Visit: Payer: Medicaid Other | Admitting: Family Medicine

## 2023-10-07 DIAGNOSIS — Z91199 Patient's noncompliance with other medical treatment and regimen due to unspecified reason: Secondary | ICD-10-CM

## 2023-10-07 NOTE — Progress Notes (Unsigned)
Patient was not seen for appt d/t no call, no show, or late arrival >10 mins past appt time.   Elise T Payne, FNP  [facility] [address] [place], [place] [address] [phone] (phone) [phone] (fax) [facility]  

## 2023-11-28 ENCOUNTER — Other Ambulatory Visit: Payer: Self-pay | Admitting: Family Medicine

## 2023-11-28 NOTE — Telephone Encounter (Signed)
Please advise 

## 2023-12-30 ENCOUNTER — Ambulatory Visit (INDEPENDENT_AMBULATORY_CARE_PROVIDER_SITE_OTHER): Payer: Medicaid Other

## 2023-12-30 VITALS — BP 125/83 | HR 68 | Ht <= 58 in | Wt 155.0 lb

## 2023-12-30 DIAGNOSIS — Z3046 Encounter for surveillance of implantable subdermal contraceptive: Secondary | ICD-10-CM

## 2023-12-30 NOTE — Progress Notes (Signed)
   NEXPLANON REMOVAL  SUBJECTIVE Lisa Solis is a 23 y.o. G1P1001 who presents today for removal of her Nexplanon a. Her Nexplanon was placed March of 20/23. She reports that this contraceptive method is no longer working for her as she feels she has been gaining weight. She does not desires another form of contraception today.   OBJECTIVE Vitals:   12/30/23 1018  BP: 125/83  Pulse: 68     Procedure Note Consent was obtained before beginning this procedure. The Nexplanon was palpated and the surrounding skin was prepped with iodine in sterile fashion. Adequate anesthesia was achieved with subdermal injection of 1% lidocaine. A skin incision was made over the distal aspect of the device. The capsule was lysed sharply and the device was removed with a hemostat. Hemostasis was achieved. The incision site was closed with with a steristrip and a pressure dressing was applied. Patient tolerated the procedure well.  Standard post-procedure care and precautions were reviewed. Patient verbalized understanding.  Lindalou Hose Ahmani Daoud, CNM

## 2024-01-17 ENCOUNTER — Telehealth (INDEPENDENT_AMBULATORY_CARE_PROVIDER_SITE_OTHER): Payer: Medicaid Other | Admitting: Family Medicine

## 2024-01-17 ENCOUNTER — Ambulatory Visit: Payer: Self-pay | Admitting: *Deleted

## 2024-01-17 DIAGNOSIS — Z91199 Patient's noncompliance with other medical treatment and regimen due to unspecified reason: Secondary | ICD-10-CM

## 2024-01-17 NOTE — Telephone Encounter (Signed)
   Summary: dx with cogid, body ache, cough, contestion, runny nose   Pt called in has dx with covid today has, body ache, cough, congestion runny nose. She wants to know how long the isolation period is so she can go back to work.       Reason for Disposition  [1] HIGH RISK patient (e.g., weak immune system, age > 64 years, obesity with BMI 30 or higher, pregnant, chronic lung disease or other chronic medical condition) AND [2] COVID symptoms (e.g., cough, fever)  (Exceptions: Already seen by PCP and no new or worsening symptoms.)  Answer Assessment - Initial Assessment Questions 1. COVID-19 DIAGNOSIS: "How do you know that you have COVID?" (e.g., positive lab test or self-test, diagnosed by doctor or NP/PA, symptoms after exposure).     + home test today 2. COVID-19 EXPOSURE: "Was there any known exposure to COVID before the symptoms began?" CDC Definition of close contact: within 6 feet (2 meters) for a total of 15 minutes or more over a 24-hour period.      unknown 3. ONSET: "When did the COVID-19 symptoms start?"      Sunday- sore throat- yesterday was fine until afternoon- body ache, headache 4. WORST SYMPTOM: "What is your worst symptom?" (e.g., cough, fever, shortness of breath, muscle aches)     Congestion- headache was bad also 5. COUGH: "Do you have a cough?" If Yes, ask: "How bad is the cough?"       Yes- dry cough 6. FEVER: "Do you have a fever?" If Yes, ask: "What is your temperature, how was it measured, and when did it start?"     no 7. RESPIRATORY STATUS: "Describe your breathing?" (e.g., normal; shortness of breath, wheezing, unable to speak)      Chest heaviness this morning 8. BETTER-SAME-WORSE: "Are you getting better, staying the same or getting worse compared to yesterday?"  If getting worse, ask, "In what way?"     Worse- getting more symptoms 9. OTHER SYMPTOMS: "Do you have any other symptoms?"  (e.g., chills, fatigue, headache, loss of smell or taste, muscle pain,  sore throat)     Headache , fatigue, sore throat 10. HIGH RISK DISEASE: "Do you have any chronic medical problems?" (e.g., asthma, heart or lung disease, weak immune system, obesity, etc.)       obesity 11. VACCINE: "Have you had the COVID-19 vaccine?" If Yes, ask: "Which one, how many shots, when did you get it?"       no 12. PREGNANCY: "Is there any chance you are pregnant?" "When was your last menstrual period?"       No- LMP- November- SA using condoms  Protocols used: Coronavirus (COVID-19) Diagnosed or Suspected-A-AH

## 2024-01-17 NOTE — Progress Notes (Signed)
   Name: Lisa Solis   MRN: [id number]    DOB: 01/22/2001   Date:01/17/2024       Progress Note  Subjective:    Chief Complaint  Chief Complaint  Patient presents with   Covid Positive    Today   Cough    X3 days   Generalized Body Aches    Pt did not connect on virtual platform. I was waiting for her for more than 30 minutes The CMA and myself called her multiple times I just left an additional voicemail at 3:20   Patient Active Problem List   Diagnosis Date Noted   Chronic tension-type headache, not intractable 08/21/2023   Family history of von Willebrand disease 08/21/2023   Class 1 obesity due to excess calories without serious comorbidity with body mass index (BMI) of 32.0 to 32.9 in adult 08/21/2023   Family history of essential hypertension 08/21/2023   Screening examination for STI 08/21/2023   Family history of early CAD 08/21/2023   Annual physical exam 08/21/2023   Well woman exam 06/15/2023   Nexplanon in place 09/16/2022    Social History   Tobacco Use   Smoking status: Every Day    Types: E-cigarettes   Smokeless tobacco: Never  Substance Use Topics   Alcohol use: Yes    Alcohol/week: 2.0 standard drinks of alcohol    Types: 2 Glasses of wine per week     Current Outpatient Medications:    buPROPion (WELLBUTRIN XL) 150 MG 24 hr tablet, TAKE 1 TABLET(150 MG) BY MOUTH DAILY (Patient not taking: Reported on 01/17/2024), Disp: 90 tablet, Rfl: 0   etonogestrel (NEXPLANON) 68 MG IMPL implant, 1 each by Subdermal route once. (Patient not taking: Reported on 01/17/2024), Disp: , Rfl:    rosuvastatin (CRESTOR) 10 MG tablet, Take 1 tablet (10 mg total) by mouth daily. (Patient not taking: Reported on 01/17/2024), Disp: 90 tablet, Rfl: 3   Semaglutide-Weight Management 0.25 MG/0.5ML SOAJ, Inject 0.25 mg into the skin once a week. (Patient not taking: Reported on 01/17/2024), Disp: 2 mL, Rfl: 0  No Known Allergies    Review of Systems    Objective:    Virtual encounter, vitals limited, only able to obtain the following There were no vitals filed for this visit. There is no height or weight on file to calculate BMI. Nursing Note and Vital Signs reviewed.  Physical Exam  PE limited by virtual encounter  No results found for this or any previous visit (from the past 72 hours).  Assessment and Plan:   No diagnosis found.   -Red flags and when to present for emergency care or RTC including fever >101.77F, chest pain, shortness of breath, new/worsening/un-resolving symptoms, reviewed with patient at time of visit. Follow up and care instructions discussed and provided in AVS. - I discussed the assessment and treatment plan with the patient. The patient was provided an opportunity to ask questions and all were answered. The patient agreed with the plan and demonstrated an understanding of the instructions.  I provided  minutes of non-face-to-face time during this encounter.  Danelle Berry, PA-C 01/17/24 3:02 PM

## 2024-01-17 NOTE — Telephone Encounter (Signed)
  Chief Complaint: + COVID- high risk- obesity- no up to date COVID vaccine Symptoms: headache, body ache, cough, congestion Frequency: symptoms started Sunday Pertinent Negatives: Patient denies fever, SOB Disposition: [] ED /[] Urgent Care (no appt availability in office) / [x] Appointment(In office/virtual)/ []  [facility]/ [] Home Care/ [] Refused Recommended Disposition /[] [facility]/ []  Follow-up with PCP Additional Notes: Appointment scheduled for COVID treatment review- BMI over 30.0

## 2024-04-09 ENCOUNTER — Ambulatory Visit (INDEPENDENT_AMBULATORY_CARE_PROVIDER_SITE_OTHER): Admitting: Family Medicine

## 2024-04-09 ENCOUNTER — Encounter: Payer: Self-pay | Admitting: Family Medicine

## 2024-04-09 VITALS — BP 118/71 | HR 64 | Resp 16 | Ht <= 58 in | Wt 158.8 lb

## 2024-04-09 DIAGNOSIS — Z87440 Personal history of urinary (tract) infections: Secondary | ICD-10-CM | POA: Diagnosis not present

## 2024-04-09 DIAGNOSIS — Z832 Family history of diseases of the blood and blood-forming organs and certain disorders involving the immune mechanism: Secondary | ICD-10-CM

## 2024-04-09 DIAGNOSIS — R8290 Unspecified abnormal findings in urine: Secondary | ICD-10-CM | POA: Diagnosis not present

## 2024-04-09 DIAGNOSIS — M5459 Other low back pain: Secondary | ICD-10-CM | POA: Diagnosis not present

## 2024-04-09 DIAGNOSIS — R319 Hematuria, unspecified: Secondary | ICD-10-CM | POA: Diagnosis not present

## 2024-04-09 DIAGNOSIS — R233 Spontaneous ecchymoses: Secondary | ICD-10-CM

## 2024-04-09 DIAGNOSIS — R109 Unspecified abdominal pain: Secondary | ICD-10-CM

## 2024-04-09 DIAGNOSIS — N390 Urinary tract infection, site not specified: Secondary | ICD-10-CM | POA: Diagnosis not present

## 2024-04-09 NOTE — Progress Notes (Signed)
 Established patient visit   Patient: Lisa Solis   DOB: 2001/04/06   22 y.o. Female  MRN: [id number] Visit activity: 04/09/2024  Today's healthcare provider: Carlean Charter, DO   Chief Complaint  Patient presents with   Follow-up    Pt wants testing done for Von Williebrand disease  no other concerns   Subjective    HPI Last annual exam 08/19/2023   Lisa Solis is a 23 year old female who presents with concerns about von Willebrand disease and back pain.  She has a family history of von Willebrand disease, as her mother had the condition and experienced significant complications, including blood clots. She has been tested for von Willebrand disease since childhood, with previous lab results showing one low level and one borderline result (Factor VIII activity).  BPl. She experiences easy bruising, stating that even minor impacts can cause bruises, usually on her legs. She has not had any significant blood clots, but after childbirth, she did pass a clot in the shower. She has not experienced prolonged bleeding from cuts, though she may need to change a Band-Aid twice in a day. She was anemic during her pregnancy in 2020 and required a blood transfusion after childbirth. Her periods, when she had them, were heavy on the first day and very light by the second day. She has not had a period since removing her Nexplanon  implant a few months ago.  She reports experiencing back pain since Saturday, which became overwhelming yesterday. The pain is located on the left side and is described as 'in between like hurt and tender.' She initially suspected a kidney stone or infection. She visited a walk-in clinic where she was diagnosed with a urinary tract infection (UTI) but has not yet started antibiotics. No pain during urination, but urine was described as 'cloudy pee'.  She is not currently on any regular medications, having stopped Wegovy  due to side effects. She was unaware of being  prescribed rosuvastatin  for high cholesterol. She engages in moderate exercise, walking twice a day at work, and has increased her water intake.      Medications: Outpatient Medications Prior to Visit  Medication Sig   rosuvastatin  (CRESTOR ) 10 MG tablet Take 1 tablet (10 mg total) by mouth daily. (Patient not taking: Reported on 01/17/2024)   [DISCONTINUED] buPROPion  (WELLBUTRIN  XL) 150 MG 24 hr tablet TAKE 1 TABLET(150 MG) BY MOUTH DAILY (Patient not taking: Reported on 01/17/2024)   [DISCONTINUED] etonogestrel  (NEXPLANON ) 68 MG IMPL implant 1 each by Subdermal route once. (Patient not taking: Reported on 01/17/2024)   [DISCONTINUED] Semaglutide -Weight Management 0.25 MG/0.5ML SOAJ Inject 0.25 mg into the skin once a week. (Patient not taking: Reported on 01/17/2024)   No facility-administered medications prior to visit.        Objective    BP 118/71 (BP Location: Left Arm, Patient Position: Sitting, Cuff Size: Normal)   Pulse 64   Resp 16   Ht 4\' 9"  (1.448 m)   Wt 158 lb 12.8 oz (72 kg)   SpO2 99%   BMI 34.36 kg/m     Physical Exam Vitals and nursing note reviewed.  Constitutional:      General: She is not in acute distress.    Appearance: Normal appearance.  HENT:     Head: Normocephalic and atraumatic.  Eyes:     General: No scleral icterus.    Conjunctiva/sclera: Conjunctivae normal.  Cardiovascular:     Rate and Rhythm: Normal rate.  Pulmonary:     Effort: Pulmonary effort is normal.  Skin:    Findings: No bruising.     Comments: Red birthmark around right wrist  Neurological:     Mental Status: She is alert and oriented to person, place, and time. Mental status is at baseline.  Psychiatric:        Mood and Affect: Mood normal.        Behavior: Behavior normal.      No results found for any visits on 04/09/24.  Assessment & Plan    Family history of von Willebrand disease -     Von Willebrand panel -     Factor 8 ristocetin cofactor  Easy bruising -      Von Willebrand panel -     Factor 8 ristocetin cofactor  Acute left flank pain  Recent urinary tract infection      Easy bruising; family history of von Willebrand's Family history of von Willebrand disease. 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  Repeat testing in 2023 was normal, as was testing in 2024 no current bruising. Discussed genetic testing. - Order von Willebrand panel and cofactor tests. - Patient to notify clinic if she would like to be referred to genetics for potential genetic screening, given the multiple patterns of inheritance and variable penetrance of von Willebrand's disease.  Urinary tract infection Diagnosed with UTI. No dysuria. No antibiotics started. - Initiate appropriate antibiotic therapy.  Elevated cholesterol Cholesterol >230 mg/dL. Risk score not feasible due to age. Increased activity and hydration. Not using rosuvastatin  due to unawareness of prescription. - Encourage lifestyle modifications - Recheck cholesterol levels at next visit; consider starting rosuvastatin  if not improved.   Return in about 19 weeks (around 08/20/2024) for CPE.      I discussed the assessment and treatment plan with the patient  The patient was provided an opportunity to ask questions and all were answered. The patient agreed with the plan and demonstrated an understanding of the instructions.   The patient was advised to call back or seek an in-person evaluation if the symptoms worsen or if the condition fails to improve as anticipated.    Carlean Charter, DO  [facility] [phone] (phone) [phone] (fax)  [facility]

## 2024-04-11 LAB — COAG STUDIES INTERP REPORT

## 2024-04-11 LAB — VON WILLEBRAND PANEL
Factor VIII Activity: 143 % — ABNORMAL HIGH (ref 56–140)
Von Willebrand Ag: 124 % (ref 50–200)
Von Willebrand Factor: 106 % (ref 50–200)

## 2024-04-23 ENCOUNTER — Ambulatory Visit: Payer: Self-pay | Admitting: Family Medicine

## 2024-05-30 DIAGNOSIS — N393 Stress incontinence (female) (male): Secondary | ICD-10-CM | POA: Diagnosis not present

## 2024-06-13 ENCOUNTER — Telehealth: Payer: Self-pay

## 2024-06-13 DIAGNOSIS — Z8249 Family history of ischemic heart disease and other diseases of the circulatory system: Secondary | ICD-10-CM

## 2024-06-28 ENCOUNTER — Telehealth: Payer: Self-pay

## 2024-06-28 NOTE — Progress Notes (Signed)
 Complex Care Management Note Care Guide Note  06/28/2024 Name: Lisa Solis MRN: [id number] DOB: [birth year]   Complex Care Management Outreach Attempts: An unsuccessful telephone outreach was attempted today to offer the patient information about available complex care management services.  Follow Up Plan:  Additional outreach attempts will be made to offer the patient complex care management information and services.   Encounter Outcome:  Patient Request to Call Back  [facility]  [facility], [facility] Health Care Management Assistant Direct Dial: [phone]  Fax: [phone]

## 2024-07-05 NOTE — Progress Notes (Signed)
 Complex Care Management Note  Care Guide Note 07/05/2024 Name: Lisa Solis MRN: [id number] DOB: May 15, 2001  Lisa Solis is a 23 y.o. year old female who sees Pardue, Lauraine SAILOR, DO for primary care. I reached out to [name] by phone today to offer complex care management services.  Ms. American Solis was given information about Complex Care Management services today including:   The Complex Care Management services include support from the care team which includes your Nurse Care Manager, Clinical Social Worker, or Pharmacist.  The Complex Care Management team is here to help remove barriers to the health concerns and goals most important to you. Complex Care Management services are voluntary, and the patient may decline or stop services at any time by request to their care team member.   Complex Care Management Consent Status: Patient did not agree to participate in complex care management services at this time.  Encounter Outcome:  No Answer  [facility]  [facility], [facility] Care Management Assistant Direct Dial: [phone]  Fax: [phone]

## 2024-07-11 ENCOUNTER — Encounter: Payer: Self-pay | Admitting: Family Medicine

## 2024-07-11 ENCOUNTER — Ambulatory Visit (INDEPENDENT_AMBULATORY_CARE_PROVIDER_SITE_OTHER): Admitting: Family Medicine

## 2024-07-11 VITALS — BP 110/69 | HR 73 | Temp 98.7°F | Ht <= 58 in | Wt 160.6 lb

## 2024-07-11 DIAGNOSIS — M545 Low back pain, unspecified: Secondary | ICD-10-CM

## 2024-07-11 DIAGNOSIS — G8929 Other chronic pain: Secondary | ICD-10-CM | POA: Diagnosis not present

## 2024-07-11 DIAGNOSIS — M546 Pain in thoracic spine: Secondary | ICD-10-CM

## 2024-07-11 DIAGNOSIS — M25572 Pain in left ankle and joints of left foot: Secondary | ICD-10-CM

## 2024-07-11 DIAGNOSIS — M542 Cervicalgia: Secondary | ICD-10-CM

## 2024-07-11 NOTE — Progress Notes (Signed)
 Established patient visit   Patient: Lisa Solis   DOB: 09-21-2001   23 y.o. Female  MRN: [id number] Visit Date: 07/11/2024  Today's healthcare provider: LAURAINE LOISE BUOY, DO   Chief Complaint  Patient presents with   Back Pain    Patient reports she is here for a referral to a chiropractor for neck and back pain.  Has history of back and neck pain but has not seen a chiropractor regarding her concerns since 2019.   Subjective    Back Pain Pertinent negatives include no numbness or weakness.  Lisa Solis is a 23 year old female who presents with worsening back, neck, and ankle pain.  She experiences worsening pain in her back, neck, and occasionally her ankle. The back pain is characterized by stiffness, primarily in the lower back, sometimes affecting the mid back. The pain has intensified since receiving an epidural during childbirth. She attributes some of the discomfort to prolonged sitting at work and poor posture.  She is not very physically active, walking the track at work only once or twice a day when weather permits. She believes her sedentary lifestyle contributes to her back pain.  No numbness, tingling, or acute injury to the back. She has not experienced any recent trauma or incidents that could have caused the pain.      Medications: Outpatient Medications Prior to Visit  Medication Sig   rosuvastatin  (CRESTOR ) 10 MG tablet Take 1 tablet (10 mg total) by mouth daily. (Patient not taking: Reported on 07/11/2024)   No facility-administered medications prior to visit.    Review of Systems  Musculoskeletal:  Positive for back pain.  Neurological:  Negative for weakness and numbness.        Objective    BP 110/69 (BP Location: Right Arm, Patient Position: Sitting, Cuff Size: Normal)   Pulse 73   Temp 98.7 F (37.1 C) (Oral)   Ht 4' 9 (1.448 m)   Wt 160 lb 9.6 oz (72.8 kg)   SpO2 99%   BMI 34.75 kg/m     Physical Exam Vitals and nursing  note reviewed.  Constitutional:      General: She is not in acute distress.    Appearance: Normal appearance.  HENT:     Head: Normocephalic and atraumatic.  Eyes:     General: No scleral icterus.    Conjunctiva/sclera: Conjunctivae normal.  Cardiovascular:     Rate and Rhythm: Normal rate.  Pulmonary:     Effort: Pulmonary effort is normal.  Neurological:     Mental Status: She is alert and oriented to person, place, and time. Mental status is at baseline.  Psychiatric:        Mood and Affect: Mood normal.        Behavior: Behavior normal.      No results found for any visits on 07/11/24.  Assessment & Plan    Neck pain, chronic -     Ambulatory referral to Chiropractic  Chronic bilateral low back pain without sciatica -     Ambulatory referral to Chiropractic  Chronic bilateral thoracic back pain -     Ambulatory referral to Chiropractic  Chronic pain of left ankle -     Ambulatory referral to Chiropractic      Chronic neck pain; chronic bilateral low and thoracic back pain Chronic neck and back pain with stiffness, exacerbated by prolonged sitting and poor posture. Likely related to weakened core muscles and tight hip flexors. -  Provide exercises to strengthen core muscles and improve posture. - Demonstrate stretching techniques for hip flexors. - Refer to chiropractic center at [facility] for evaluation and management, per patient preference.  Chronic left ankle pain Intermittent pain without acute injury.  Referral as noted above.  General Health Maintenance Confirmed hepatitis B immunity. Discussed flu vaccination timing. - Advise flu vaccination in late September or early October.    Return if symptoms worsen or fail to improve.      I discussed the assessment and treatment plan with the patient  The patient was provided an opportunity to ask questions and all were answered. The patient agreed with the plan and demonstrated an understanding of the  instructions.   The patient was advised to call back or seek an in-person evaluation if the symptoms worsen or if the condition fails to improve as anticipated.    LAURAINE LOISE BUOY, DO  [facility] [phone] (phone) [phone] (fax)  [facility]

## 2024-07-16 DIAGNOSIS — M5412 Radiculopathy, cervical region: Secondary | ICD-10-CM | POA: Diagnosis not present

## 2024-07-16 DIAGNOSIS — R519 Headache, unspecified: Secondary | ICD-10-CM | POA: Diagnosis not present

## 2024-07-16 DIAGNOSIS — M9911 Subluxation complex (vertebral) of cervical region: Secondary | ICD-10-CM | POA: Diagnosis not present

## 2024-07-16 DIAGNOSIS — M25572 Pain in left ankle and joints of left foot: Secondary | ICD-10-CM | POA: Diagnosis not present

## 2024-07-16 DIAGNOSIS — M9903 Segmental and somatic dysfunction of lumbar region: Secondary | ICD-10-CM | POA: Diagnosis not present

## 2024-07-16 DIAGNOSIS — M9902 Segmental and somatic dysfunction of thoracic region: Secondary | ICD-10-CM | POA: Diagnosis not present

## 2024-07-16 DIAGNOSIS — M62830 Muscle spasm of back: Secondary | ICD-10-CM | POA: Diagnosis not present

## 2024-07-16 DIAGNOSIS — M546 Pain in thoracic spine: Secondary | ICD-10-CM | POA: Diagnosis not present

## 2024-07-16 DIAGNOSIS — M9901 Segmental and somatic dysfunction of cervical region: Secondary | ICD-10-CM | POA: Diagnosis not present

## 2024-07-17 DIAGNOSIS — M9901 Segmental and somatic dysfunction of cervical region: Secondary | ICD-10-CM | POA: Diagnosis not present

## 2024-07-17 DIAGNOSIS — R519 Headache, unspecified: Secondary | ICD-10-CM | POA: Diagnosis not present

## 2024-07-17 DIAGNOSIS — M9911 Subluxation complex (vertebral) of cervical region: Secondary | ICD-10-CM | POA: Diagnosis not present

## 2024-07-17 DIAGNOSIS — M9902 Segmental and somatic dysfunction of thoracic region: Secondary | ICD-10-CM | POA: Diagnosis not present

## 2024-07-17 DIAGNOSIS — M25572 Pain in left ankle and joints of left foot: Secondary | ICD-10-CM | POA: Diagnosis not present

## 2024-07-17 DIAGNOSIS — M9903 Segmental and somatic dysfunction of lumbar region: Secondary | ICD-10-CM | POA: Diagnosis not present

## 2024-07-17 DIAGNOSIS — M62830 Muscle spasm of back: Secondary | ICD-10-CM | POA: Diagnosis not present

## 2024-07-17 DIAGNOSIS — M546 Pain in thoracic spine: Secondary | ICD-10-CM | POA: Diagnosis not present

## 2024-07-17 DIAGNOSIS — M5412 Radiculopathy, cervical region: Secondary | ICD-10-CM | POA: Diagnosis not present

## 2024-07-18 ENCOUNTER — Encounter: Payer: Self-pay | Admitting: Family Medicine

## 2024-07-18 ENCOUNTER — Ambulatory Visit: Admitting: Family Medicine

## 2024-07-19 DIAGNOSIS — R519 Headache, unspecified: Secondary | ICD-10-CM | POA: Diagnosis not present

## 2024-07-19 DIAGNOSIS — M9902 Segmental and somatic dysfunction of thoracic region: Secondary | ICD-10-CM | POA: Diagnosis not present

## 2024-07-19 DIAGNOSIS — M5412 Radiculopathy, cervical region: Secondary | ICD-10-CM | POA: Diagnosis not present

## 2024-07-19 DIAGNOSIS — M25572 Pain in left ankle and joints of left foot: Secondary | ICD-10-CM | POA: Diagnosis not present

## 2024-07-19 DIAGNOSIS — M9903 Segmental and somatic dysfunction of lumbar region: Secondary | ICD-10-CM | POA: Diagnosis not present

## 2024-07-19 DIAGNOSIS — M9911 Subluxation complex (vertebral) of cervical region: Secondary | ICD-10-CM | POA: Diagnosis not present

## 2024-07-19 DIAGNOSIS — M62830 Muscle spasm of back: Secondary | ICD-10-CM | POA: Diagnosis not present

## 2024-07-19 DIAGNOSIS — M9901 Segmental and somatic dysfunction of cervical region: Secondary | ICD-10-CM | POA: Diagnosis not present

## 2024-07-19 DIAGNOSIS — M546 Pain in thoracic spine: Secondary | ICD-10-CM | POA: Diagnosis not present

## 2024-07-23 DIAGNOSIS — M9911 Subluxation complex (vertebral) of cervical region: Secondary | ICD-10-CM | POA: Diagnosis not present

## 2024-07-23 DIAGNOSIS — M546 Pain in thoracic spine: Secondary | ICD-10-CM | POA: Diagnosis not present

## 2024-07-23 DIAGNOSIS — M62830 Muscle spasm of back: Secondary | ICD-10-CM | POA: Diagnosis not present

## 2024-07-23 DIAGNOSIS — M5412 Radiculopathy, cervical region: Secondary | ICD-10-CM | POA: Diagnosis not present

## 2024-07-23 DIAGNOSIS — M25572 Pain in left ankle and joints of left foot: Secondary | ICD-10-CM | POA: Diagnosis not present

## 2024-07-23 DIAGNOSIS — R519 Headache, unspecified: Secondary | ICD-10-CM | POA: Diagnosis not present

## 2024-07-23 DIAGNOSIS — M9903 Segmental and somatic dysfunction of lumbar region: Secondary | ICD-10-CM | POA: Diagnosis not present

## 2024-07-23 DIAGNOSIS — M9901 Segmental and somatic dysfunction of cervical region: Secondary | ICD-10-CM | POA: Diagnosis not present

## 2024-07-23 DIAGNOSIS — M9902 Segmental and somatic dysfunction of thoracic region: Secondary | ICD-10-CM | POA: Diagnosis not present

## 2024-07-25 DIAGNOSIS — M9901 Segmental and somatic dysfunction of cervical region: Secondary | ICD-10-CM | POA: Diagnosis not present

## 2024-07-25 DIAGNOSIS — M5412 Radiculopathy, cervical region: Secondary | ICD-10-CM | POA: Diagnosis not present

## 2024-07-25 DIAGNOSIS — M9902 Segmental and somatic dysfunction of thoracic region: Secondary | ICD-10-CM | POA: Diagnosis not present

## 2024-07-25 DIAGNOSIS — M546 Pain in thoracic spine: Secondary | ICD-10-CM | POA: Diagnosis not present

## 2024-07-25 DIAGNOSIS — M25572 Pain in left ankle and joints of left foot: Secondary | ICD-10-CM | POA: Diagnosis not present

## 2024-07-25 DIAGNOSIS — M62830 Muscle spasm of back: Secondary | ICD-10-CM | POA: Diagnosis not present

## 2024-07-25 DIAGNOSIS — R519 Headache, unspecified: Secondary | ICD-10-CM | POA: Diagnosis not present

## 2024-07-25 DIAGNOSIS — M9903 Segmental and somatic dysfunction of lumbar region: Secondary | ICD-10-CM | POA: Diagnosis not present

## 2024-07-25 DIAGNOSIS — M9911 Subluxation complex (vertebral) of cervical region: Secondary | ICD-10-CM | POA: Diagnosis not present

## 2024-07-26 DIAGNOSIS — M25572 Pain in left ankle and joints of left foot: Secondary | ICD-10-CM | POA: Diagnosis not present

## 2024-07-26 DIAGNOSIS — M62830 Muscle spasm of back: Secondary | ICD-10-CM | POA: Diagnosis not present

## 2024-07-26 DIAGNOSIS — M9901 Segmental and somatic dysfunction of cervical region: Secondary | ICD-10-CM | POA: Diagnosis not present

## 2024-07-26 DIAGNOSIS — M9911 Subluxation complex (vertebral) of cervical region: Secondary | ICD-10-CM | POA: Diagnosis not present

## 2024-07-26 DIAGNOSIS — M9903 Segmental and somatic dysfunction of lumbar region: Secondary | ICD-10-CM | POA: Diagnosis not present

## 2024-07-26 DIAGNOSIS — M5412 Radiculopathy, cervical region: Secondary | ICD-10-CM | POA: Diagnosis not present

## 2024-07-26 DIAGNOSIS — R519 Headache, unspecified: Secondary | ICD-10-CM | POA: Diagnosis not present

## 2024-07-26 DIAGNOSIS — M546 Pain in thoracic spine: Secondary | ICD-10-CM | POA: Diagnosis not present

## 2024-07-26 DIAGNOSIS — M9902 Segmental and somatic dysfunction of thoracic region: Secondary | ICD-10-CM | POA: Diagnosis not present

## 2024-07-30 DIAGNOSIS — M9902 Segmental and somatic dysfunction of thoracic region: Secondary | ICD-10-CM | POA: Diagnosis not present

## 2024-07-30 DIAGNOSIS — M9901 Segmental and somatic dysfunction of cervical region: Secondary | ICD-10-CM | POA: Diagnosis not present

## 2024-07-30 DIAGNOSIS — M5412 Radiculopathy, cervical region: Secondary | ICD-10-CM | POA: Diagnosis not present

## 2024-07-30 DIAGNOSIS — M9911 Subluxation complex (vertebral) of cervical region: Secondary | ICD-10-CM | POA: Diagnosis not present

## 2024-07-30 DIAGNOSIS — M62830 Muscle spasm of back: Secondary | ICD-10-CM | POA: Diagnosis not present

## 2024-07-30 DIAGNOSIS — R519 Headache, unspecified: Secondary | ICD-10-CM | POA: Diagnosis not present

## 2024-07-30 DIAGNOSIS — M25572 Pain in left ankle and joints of left foot: Secondary | ICD-10-CM | POA: Diagnosis not present

## 2024-07-30 DIAGNOSIS — M9903 Segmental and somatic dysfunction of lumbar region: Secondary | ICD-10-CM | POA: Diagnosis not present

## 2024-07-30 DIAGNOSIS — M546 Pain in thoracic spine: Secondary | ICD-10-CM | POA: Diagnosis not present

## 2024-08-01 DIAGNOSIS — M9901 Segmental and somatic dysfunction of cervical region: Secondary | ICD-10-CM | POA: Diagnosis not present

## 2024-08-01 DIAGNOSIS — M25572 Pain in left ankle and joints of left foot: Secondary | ICD-10-CM | POA: Diagnosis not present

## 2024-08-01 DIAGNOSIS — R519 Headache, unspecified: Secondary | ICD-10-CM | POA: Diagnosis not present

## 2024-08-01 DIAGNOSIS — M9911 Subluxation complex (vertebral) of cervical region: Secondary | ICD-10-CM | POA: Diagnosis not present

## 2024-08-01 DIAGNOSIS — M5412 Radiculopathy, cervical region: Secondary | ICD-10-CM | POA: Diagnosis not present

## 2024-08-01 DIAGNOSIS — M9902 Segmental and somatic dysfunction of thoracic region: Secondary | ICD-10-CM | POA: Diagnosis not present

## 2024-08-01 DIAGNOSIS — M62830 Muscle spasm of back: Secondary | ICD-10-CM | POA: Diagnosis not present

## 2024-08-01 DIAGNOSIS — M546 Pain in thoracic spine: Secondary | ICD-10-CM | POA: Diagnosis not present

## 2024-08-01 DIAGNOSIS — M9903 Segmental and somatic dysfunction of lumbar region: Secondary | ICD-10-CM | POA: Diagnosis not present

## 2024-08-03 DIAGNOSIS — M62830 Muscle spasm of back: Secondary | ICD-10-CM | POA: Diagnosis not present

## 2024-08-03 DIAGNOSIS — M25572 Pain in left ankle and joints of left foot: Secondary | ICD-10-CM | POA: Diagnosis not present

## 2024-08-03 DIAGNOSIS — M9901 Segmental and somatic dysfunction of cervical region: Secondary | ICD-10-CM | POA: Diagnosis not present

## 2024-08-03 DIAGNOSIS — R519 Headache, unspecified: Secondary | ICD-10-CM | POA: Diagnosis not present

## 2024-08-03 DIAGNOSIS — M9903 Segmental and somatic dysfunction of lumbar region: Secondary | ICD-10-CM | POA: Diagnosis not present

## 2024-08-03 DIAGNOSIS — M9911 Subluxation complex (vertebral) of cervical region: Secondary | ICD-10-CM | POA: Diagnosis not present

## 2024-08-03 DIAGNOSIS — M9902 Segmental and somatic dysfunction of thoracic region: Secondary | ICD-10-CM | POA: Diagnosis not present

## 2024-08-03 DIAGNOSIS — M546 Pain in thoracic spine: Secondary | ICD-10-CM | POA: Diagnosis not present

## 2024-08-03 DIAGNOSIS — M5412 Radiculopathy, cervical region: Secondary | ICD-10-CM | POA: Diagnosis not present

## 2024-08-07 DIAGNOSIS — M9911 Subluxation complex (vertebral) of cervical region: Secondary | ICD-10-CM | POA: Diagnosis not present

## 2024-08-07 DIAGNOSIS — M9903 Segmental and somatic dysfunction of lumbar region: Secondary | ICD-10-CM | POA: Diagnosis not present

## 2024-08-07 DIAGNOSIS — M9902 Segmental and somatic dysfunction of thoracic region: Secondary | ICD-10-CM | POA: Diagnosis not present

## 2024-08-07 DIAGNOSIS — M62830 Muscle spasm of back: Secondary | ICD-10-CM | POA: Diagnosis not present

## 2024-08-07 DIAGNOSIS — M5412 Radiculopathy, cervical region: Secondary | ICD-10-CM | POA: Diagnosis not present

## 2024-08-07 DIAGNOSIS — M546 Pain in thoracic spine: Secondary | ICD-10-CM | POA: Diagnosis not present

## 2024-08-07 DIAGNOSIS — M25572 Pain in left ankle and joints of left foot: Secondary | ICD-10-CM | POA: Diagnosis not present

## 2024-08-07 DIAGNOSIS — R519 Headache, unspecified: Secondary | ICD-10-CM | POA: Diagnosis not present

## 2024-08-07 DIAGNOSIS — M9901 Segmental and somatic dysfunction of cervical region: Secondary | ICD-10-CM | POA: Diagnosis not present

## 2024-08-09 DIAGNOSIS — M25572 Pain in left ankle and joints of left foot: Secondary | ICD-10-CM | POA: Diagnosis not present

## 2024-08-09 DIAGNOSIS — M9902 Segmental and somatic dysfunction of thoracic region: Secondary | ICD-10-CM | POA: Diagnosis not present

## 2024-08-09 DIAGNOSIS — M546 Pain in thoracic spine: Secondary | ICD-10-CM | POA: Diagnosis not present

## 2024-08-09 DIAGNOSIS — M5412 Radiculopathy, cervical region: Secondary | ICD-10-CM | POA: Diagnosis not present

## 2024-08-09 DIAGNOSIS — M9901 Segmental and somatic dysfunction of cervical region: Secondary | ICD-10-CM | POA: Diagnosis not present

## 2024-08-09 DIAGNOSIS — R519 Headache, unspecified: Secondary | ICD-10-CM | POA: Diagnosis not present

## 2024-08-09 DIAGNOSIS — M9911 Subluxation complex (vertebral) of cervical region: Secondary | ICD-10-CM | POA: Diagnosis not present

## 2024-08-09 DIAGNOSIS — M9903 Segmental and somatic dysfunction of lumbar region: Secondary | ICD-10-CM | POA: Diagnosis not present

## 2024-08-09 DIAGNOSIS — M62830 Muscle spasm of back: Secondary | ICD-10-CM | POA: Diagnosis not present

## 2024-08-10 ENCOUNTER — Encounter: Admitting: Family Medicine

## 2024-08-14 DIAGNOSIS — M9901 Segmental and somatic dysfunction of cervical region: Secondary | ICD-10-CM | POA: Diagnosis not present

## 2024-08-14 DIAGNOSIS — M5412 Radiculopathy, cervical region: Secondary | ICD-10-CM | POA: Diagnosis not present

## 2024-08-14 DIAGNOSIS — M546 Pain in thoracic spine: Secondary | ICD-10-CM | POA: Diagnosis not present

## 2024-08-14 DIAGNOSIS — R519 Headache, unspecified: Secondary | ICD-10-CM | POA: Diagnosis not present

## 2024-08-14 DIAGNOSIS — M62830 Muscle spasm of back: Secondary | ICD-10-CM | POA: Diagnosis not present

## 2024-08-14 DIAGNOSIS — M9911 Subluxation complex (vertebral) of cervical region: Secondary | ICD-10-CM | POA: Diagnosis not present

## 2024-08-14 DIAGNOSIS — M9903 Segmental and somatic dysfunction of lumbar region: Secondary | ICD-10-CM | POA: Diagnosis not present

## 2024-08-14 DIAGNOSIS — M9902 Segmental and somatic dysfunction of thoracic region: Secondary | ICD-10-CM | POA: Diagnosis not present

## 2024-08-14 DIAGNOSIS — M25572 Pain in left ankle and joints of left foot: Secondary | ICD-10-CM | POA: Diagnosis not present

## 2024-08-16 DIAGNOSIS — M5412 Radiculopathy, cervical region: Secondary | ICD-10-CM | POA: Diagnosis not present

## 2024-08-16 DIAGNOSIS — M9902 Segmental and somatic dysfunction of thoracic region: Secondary | ICD-10-CM | POA: Diagnosis not present

## 2024-08-16 DIAGNOSIS — M9901 Segmental and somatic dysfunction of cervical region: Secondary | ICD-10-CM | POA: Diagnosis not present

## 2024-08-16 DIAGNOSIS — M9911 Subluxation complex (vertebral) of cervical region: Secondary | ICD-10-CM | POA: Diagnosis not present

## 2024-08-16 DIAGNOSIS — R519 Headache, unspecified: Secondary | ICD-10-CM | POA: Diagnosis not present

## 2024-08-16 DIAGNOSIS — M62830 Muscle spasm of back: Secondary | ICD-10-CM | POA: Diagnosis not present

## 2024-08-16 DIAGNOSIS — M25572 Pain in left ankle and joints of left foot: Secondary | ICD-10-CM | POA: Diagnosis not present

## 2024-08-16 DIAGNOSIS — M546 Pain in thoracic spine: Secondary | ICD-10-CM | POA: Diagnosis not present

## 2024-08-16 DIAGNOSIS — M9903 Segmental and somatic dysfunction of lumbar region: Secondary | ICD-10-CM | POA: Diagnosis not present

## 2024-08-20 DIAGNOSIS — M9903 Segmental and somatic dysfunction of lumbar region: Secondary | ICD-10-CM | POA: Diagnosis not present

## 2024-08-20 DIAGNOSIS — M5412 Radiculopathy, cervical region: Secondary | ICD-10-CM | POA: Diagnosis not present

## 2024-08-20 DIAGNOSIS — R519 Headache, unspecified: Secondary | ICD-10-CM | POA: Diagnosis not present

## 2024-08-20 DIAGNOSIS — M9911 Subluxation complex (vertebral) of cervical region: Secondary | ICD-10-CM | POA: Diagnosis not present

## 2024-08-20 DIAGNOSIS — M25572 Pain in left ankle and joints of left foot: Secondary | ICD-10-CM | POA: Diagnosis not present

## 2024-08-20 DIAGNOSIS — M9901 Segmental and somatic dysfunction of cervical region: Secondary | ICD-10-CM | POA: Diagnosis not present

## 2024-08-20 DIAGNOSIS — M62830 Muscle spasm of back: Secondary | ICD-10-CM | POA: Diagnosis not present

## 2024-08-20 DIAGNOSIS — M9902 Segmental and somatic dysfunction of thoracic region: Secondary | ICD-10-CM | POA: Diagnosis not present

## 2024-08-20 DIAGNOSIS — M546 Pain in thoracic spine: Secondary | ICD-10-CM | POA: Diagnosis not present

## 2024-08-21 ENCOUNTER — Encounter: Payer: Self-pay | Admitting: Family Medicine

## 2024-08-21 ENCOUNTER — Ambulatory Visit (INDEPENDENT_AMBULATORY_CARE_PROVIDER_SITE_OTHER): Admitting: Family Medicine

## 2024-08-21 VITALS — BP 115/79 | HR 68 | Resp 16 | Ht 59.0 in | Wt 159.7 lb

## 2024-08-21 DIAGNOSIS — E6609 Other obesity due to excess calories: Secondary | ICD-10-CM | POA: Diagnosis not present

## 2024-08-21 DIAGNOSIS — Z0000 Encounter for general adult medical examination without abnormal findings: Secondary | ICD-10-CM | POA: Diagnosis not present

## 2024-08-21 DIAGNOSIS — E782 Mixed hyperlipidemia: Secondary | ICD-10-CM | POA: Diagnosis not present

## 2024-08-21 DIAGNOSIS — E66811 Obesity, class 1: Secondary | ICD-10-CM

## 2024-08-21 DIAGNOSIS — F17201 Nicotine dependence, unspecified, in remission: Secondary | ICD-10-CM | POA: Diagnosis not present

## 2024-08-21 DIAGNOSIS — E559 Vitamin D deficiency, unspecified: Secondary | ICD-10-CM | POA: Diagnosis not present

## 2024-08-21 DIAGNOSIS — R7309 Other abnormal glucose: Secondary | ICD-10-CM | POA: Insufficient documentation

## 2024-08-21 DIAGNOSIS — D649 Anemia, unspecified: Secondary | ICD-10-CM | POA: Diagnosis not present

## 2024-08-21 DIAGNOSIS — Z6832 Body mass index (BMI) 32.0-32.9, adult: Secondary | ICD-10-CM | POA: Diagnosis not present

## 2024-08-21 NOTE — Progress Notes (Signed)
 Complete physical exam   Patient: Lisa Solis   DOB: 04/12/2001   23 y.o. Female  MRN: [id number] Visit Date: 08/21/2024  Today's healthcare provider: LAURAINE LOISE BUOY, DO   Chief Complaint  Patient presents with   Annual Exam    Patient here for physical exam. She is exercising. She is sleeping fairly well. Flu Vaccine: declined today.   Subjective    Lisa Solis is a 23 y.o. female who presents today for a complete physical exam.   HPI HPI     Annual Exam    Additional comments: Patient here for physical exam. She is exercising. She is sleeping fairly well. Flu Vaccine: declined today.      Last edited by Rosas, Joseline E, CMA on 08/21/2024  8:42 AM.      Lisa Solis is a 23 year old female who presents for an annual physical exam.  She experiences occasional breast pain, which was persistent for about an hour on Saturday. The pain does not change with pressure. No associated chest pain, shortness of breath, headaches, dizziness, or lightheadedness.  She notes an increase in thirst, particularly when sitting at her desk, but attributes it to feeling thirsty rather than intentional increased water intake.  She engages in regular physical activity, including walking the track at work during breaks and walking around her apartment complex with her son at least once a day. She has quit smoking as it started to make her feel unwell.  She has a history of anemia from a couple of years ago but is not aware of any current issues related to it. Family history includes high cholesterol.    Past Medical History:  Diagnosis Date   Blood transfusion without reported diagnosis    r/t delivery of son   COVID-19 08/2020   Heart murmur    ages [age]   Nexplanon  in place 09/16/2022   Insertion after removal of Nexplanon  09/16/2022     Ovarian cyst    Past Surgical History:  Procedure Laterality Date   CESAREAN SECTION N/A 08/07/2019   Procedure: CESAREAN SECTION;   Surgeon: Leonce Garnette BIRCH, MD;  Location: [facility] ORS;  Service: Obstetrics;  Laterality: N/A;   CYST REMOVAL NECK     Social History   Socioeconomic History   Marital status: Single    Spouse name: Not on file   Number of children: Not on file   Years of education: Not on file   Highest education level: 12th grade  Occupational History   Not on file  Tobacco Use   Smoking status: Former    Types: E-cigarettes   Smokeless tobacco: Never  Vaping Use   Vaping status: Every Day  Substance and Sexual Activity   Alcohol use: Yes    Alcohol/week: 2.0 standard drinks of alcohol    Types: 2 Glasses of wine per week   Drug use: Never   Sexual activity: Yes    Birth control/protection: Implant  Other Topics Concern   Not on file  Social History Narrative   Not on file   Social Drivers of Health   Financial Resource Strain: High Risk (07/09/2024)   Overall Financial Resource Strain (CARDIA)    Difficulty of Paying Living Expenses: Hard  Food Insecurity: Food Insecurity Present (07/09/2024)   Hunger Vital Sign    Worried About Running Out of Food in the Last Year: Sometimes true    Ran Out of Food in the Last Year: Sometimes true  Transportation Needs: No Transportation Needs (07/09/2024)   PRAPARE - Administrator, Civil Service (Medical): No    Lack of Transportation (Non-Medical): No  Physical Activity: Insufficiently Active (07/09/2024)   Exercise Vital Sign    Days of Exercise per Week: 5 days    Minutes of Exercise per Session: 20 min  Stress: Stress Concern Present (07/09/2024)   [employer] of Occupational Health - Occupational Stress Questionnaire    Feeling of Stress: Rather much  Social Connections: Socially Isolated (07/09/2024)   Social Connection and Isolation Panel    Frequency of Communication with Friends and Family: Three times a week    Frequency of Social Gatherings with Friends and Family: Three times a week    Attends Religious Services: Never     Active Member of Clubs or Organizations: No    Attends Banker Meetings: Not on file    Marital Status: Never married  Intimate Partner Violence: Not At Risk (08/07/2019)   Humiliation, Afraid, Rape, and Kick questionnaire    Fear of Current or Ex-Partner: No    Emotionally Abused: No    Physically Abused: No    Sexually Abused: No   Family Status  Relation Name Status   Mother  Deceased   Father  Alive   Brother  Alive   MGM  Alive  No partnership data on file   Family History  Problem Relation Age of Onset   Hypertension Mother        08/18/2022   Heart attack Mother    Cerebral palsy Brother    Hypertension Maternal Grandmother    No Known Allergies  Patient Care Team: Sherman Donaldson N, DO as PCP - General (Family Medicine)   Medications: Outpatient Medications Prior to Visit  Medication Sig   rosuvastatin  (CRESTOR ) 10 MG tablet Take 1 tablet (10 mg total) by mouth daily. (Patient not taking: Reported on 07/11/2024)   No facility-administered medications prior to visit.    Review of Systems  Constitutional:  Negative for chills, fatigue and fever.  HENT:  Negative for congestion, ear pain, rhinorrhea, sneezing and sore throat.   Eyes: Negative.  Negative for pain and redness.  Respiratory:  Negative for cough, shortness of breath and wheezing.   Cardiovascular:  Negative for chest pain and leg swelling.  Gastrointestinal:  Negative for abdominal pain, blood in stool, constipation, diarrhea and nausea.  Endocrine: Negative for polydipsia and polyphagia.  Genitourinary: Negative.  Negative for dysuria, flank pain, hematuria, pelvic pain, vaginal bleeding and vaginal discharge.  Musculoskeletal:  Positive for myalgias (over right breast, intermittently). Negative for arthralgias, back pain, gait problem and joint swelling.  Skin:  Negative for rash.  Neurological: Negative.  Negative for dizziness, tremors, seizures, weakness, light-headedness, numbness and  headaches.  Hematological:  Negative for adenopathy.  Psychiatric/Behavioral: Negative.  Negative for behavioral problems, confusion and dysphoric mood. The patient is not nervous/anxious and is not hyperactive.       Objective    BP 115/79 (BP Location: Left Arm, Patient Position: Sitting, Cuff Size: Normal)   Pulse 68   Resp 16   Ht 4' 11 (1.499 m)   Wt 159 lb 11.2 oz (72.4 kg)   LMP 08/05/2024 (Exact Date) Comment: Patient reports she had a period for 3 weeks.  SpO2 98%   BMI 32.26 kg/m    Physical Exam Vitals and nursing note reviewed.  Constitutional:      General: She is awake.     Appearance:  Normal appearance. She is obese.  HENT:     Head: Normocephalic and atraumatic.     Right Ear: Tympanic membrane, ear canal and external ear normal.     Left Ear: Tympanic membrane, ear canal and external ear normal.     Nose: Nose normal.     Mouth/Throat:     Mouth: Mucous membranes are moist.     Pharynx: Oropharynx is clear. No oropharyngeal exudate or posterior oropharyngeal erythema.  Eyes:     General: No scleral icterus.    Extraocular Movements: Extraocular movements intact.     Conjunctiva/sclera: Conjunctivae normal.     Pupils: Pupils are equal, round, and reactive to light.  Neck:     Thyroid : No thyromegaly or thyroid  tenderness.  Cardiovascular:     Rate and Rhythm: Normal rate and regular rhythm.     Pulses: Normal pulses.     Heart sounds: Normal heart sounds.  Pulmonary:     Effort: Pulmonary effort is normal. No tachypnea, bradypnea or respiratory distress.     Breath sounds: Normal breath sounds. No stridor. No wheezing, rhonchi or rales.  Abdominal:     General: Bowel sounds are normal. There is no distension.     Palpations: Abdomen is soft. There is no mass.     Tenderness: There is no abdominal tenderness. There is no guarding.     Hernia: No hernia is present.  Musculoskeletal:     Cervical back: Normal range of motion and neck supple.      Right lower leg: No edema.     Left lower leg: No edema.  Lymphadenopathy:     Cervical: No cervical adenopathy.  Skin:    General: Skin is warm and dry.  Neurological:     Mental Status: She is alert and oriented to person, place, and time. Mental status is at baseline.  Psychiatric:        Mood and Affect: Mood normal.        Behavior: Behavior normal.       Last depression screening scores    08/21/2024    8:43 AM 07/11/2024    1:10 PM 04/09/2024   10:32 AM  PHQ 2/9 Scores  PHQ - 2 Score 2 0 2  PHQ- 9 Score 8 6 9    Last fall risk screening    08/21/2024    8:43 AM  Fall Risk   Falls in the past year? 0  Number falls in past yr: 0  Injury with Fall? 0  Risk for fall due to : No Fall Risks   Last Audit-C alcohol use screening    08/21/2024    9:00 AM  Alcohol Use Disorder Test (AUDIT)  1. How often do you have a drink containing alcohol? 1  2. How many drinks containing alcohol do you have on a typical day when you are drinking? 0  3. How often do you have six or more drinks on one occasion? 0  AUDIT-C Score 1   A score of 3 or more in women, and 4 or more in men indicates increased risk for alcohol abuse, EXCEPT if all of the points are from question 1   No results found for any visits on 08/21/24.  Assessment & Plan    Routine Health Maintenance and Physical Exam  Exercise Activities and Dietary recommendations  Goals      Exercise 150 min/wk Moderate Activity        Immunization History  Administered Date(s) Administered  DTaP 09/08/1999, 11/16/1999, 01/19/2000, 03/23/2001   HIB, Unspecified 09/08/1999, 11/16/1999, 01/19/2000, 03/23/2001   HPV Quadrivalent 11/30/2010, 10/23/2013, 02/26/2014   Hep B, Unspecified 1999-07-07, 08/24/1999, 03/23/2001   Hepatitis A, Ped/Adol-2 Dose 10/11/2008, 10/24/2009   Hepatitis B, ADULT 09/21/2020   IPV 09/08/1999, 11/16/1999, 07/25/2000   Influenza Inj Mdck Quad Pf 11/16/2018   Influenza Inj Mdck Quad With  Preservative 12/29/2017   Influenza, Seasonal, Injecte, Preservative Fre 10/11/2008, 10/24/2009, 11/30/2010, 08/19/2023   Influenza,inj,Quad PF,6+ Mos 10/23/2013, 10/25/2014, 11/24/2016, 07/30/2019   MMR 07/25/2000, 05/04/2004   Meningococcal B, OMV 11/24/2016, 02/16/2018   Meningococcal Mcv4o 11/30/2010   Pfizer(Comirnaty)Fall Seasonal Vaccine 12 years and older 11/09/2020, 02/07/2021   Pneumococcal Conjugate PCV 7 09/08/1999, 11/16/1999, 01/19/2000, 07/25/2000   Polio, Unspecified 05/04/2004   Tdap 10/24/2009, 05/31/2019, 09/20/2020   Varicella 07/25/2000, 10/11/2008    Health Maintenance  Topic Date Due   Influenza Vaccine  03/05/2025 (Originally 07/06/2024)   COVID-19 Vaccine (3 - 2025-26 season) 08/20/2025 (Originally 08/06/2024)   Cervical Cancer Screening (Pap smear)  02/24/2025   DTaP/Tdap/Td (8 - Td or Tdap) 09/20/2030   HPV VACCINES  Completed   Hepatitis C Screening  Completed   HIV Screening  Completed   Meningococcal B Vaccine  Completed   Pneumococcal Vaccine  Aged Out   Hepatitis B Vaccines 19-59 Average Risk  Discontinued    Discussed health benefits of physical activity, and encouraged her to engage in regular exercise appropriate for her age and condition.   Annual physical exam  Mixed hyperlipidemia -     Lipid panel  Vitamin D  deficiency -     VITAMIN D  25 Hydroxy (Vit-D Deficiency, Fractures)  Anemia, unspecified type -     CBC  Elevated hemoglobin A1c -     Hemoglobin A1c  Obesity (BMI 30.0-34.9) -     Comprehensive metabolic panel with GFR  Nicotine dependence in remission, unspecified nicotine product type     Annual physical exam Routine visit with no acute concerns.  Physical exam overall unremarkable except as noted above. Routine lab work ordered as noted.  Discussed exercise and hydration. Stopped smoking.   Elevated hemoglobin A1c Reports increased thirst. - Order A1c test.  Mixed hyperlipidemia Family history of high cholesterol.  Not taking rosuvastatin  (forgot about it). - Order cholesterol panel.    Obesity (BMI 30.0-34.9) Counseled patient on lifestyle modifications.  Vitamin D  deficiency Not supplementing. - Order vitamin D  level.  Anemia, unspecified Anemia history with previous blood transfusion; no current symptoms.    Return in about 1 year (around 08/21/2025) for CPE.     I discussed the assessment and treatment plan with the patient  The patient was provided an opportunity to ask questions and all were answered. The patient agreed with the plan and demonstrated an understanding of the instructions.   The patient was advised to call back or seek an in-person evaluation if the symptoms worsen or if the condition fails to improve as anticipated.    LAURAINE LOISE BUOY, DO  [facility] [phone] (phone) [phone] (fax)  [facility]

## 2024-08-22 LAB — COMPREHENSIVE METABOLIC PANEL WITH GFR
ALT: 22 IU/L (ref 0–32)
AST: 20 IU/L (ref 0–40)
Albumin: 4.2 g/dL (ref 4.0–5.0)
Alkaline Phosphatase: 82 IU/L (ref 41–116)
BUN/Creatinine Ratio: 23 (ref 9–23)
BUN: 15 mg/dL (ref 6–20)
Bilirubin Total: <0.2 mg/dL (ref 0.0–1.2)
CO2: 19 mmol/L — ABNORMAL LOW (ref 20–29)
Calcium: 9.4 mg/dL (ref 8.7–10.2)
Chloride: 104 mmol/L (ref 96–106)
Creatinine, Ser: 0.65 mg/dL (ref 0.57–1.00)
Globulin, Total: 2.1 g/dL (ref 1.5–4.5)
Glucose: 94 mg/dL (ref 70–99)
Potassium: 4.5 mmol/L (ref 3.5–5.2)
Sodium: 138 mmol/L (ref 134–144)
Total Protein: 6.3 g/dL (ref 6.0–8.5)
eGFR: 125 mL/min/1.73 (ref >59)

## 2024-08-22 LAB — CBC
Hematocrit: 40.8 % (ref 34.0–46.6)
Hemoglobin: 12.7 g/dL (ref 11.1–15.9)
MCH: 25.8 pg — ABNORMAL LOW (ref 26.6–33.0)
MCHC: 31.1 g/dL — ABNORMAL LOW (ref 31.5–35.7)
MCV: 83 fL (ref 79–97)
Platelets: 292 x10E3/uL (ref 150–450)
RBC: 4.93 x10E6/uL (ref 3.77–5.28)
RDW: 13.7 % (ref 11.7–15.4)
WBC: 7.4 x10E3/uL (ref 3.4–10.8)

## 2024-08-22 LAB — VITAMIN D 25 HYDROXY (VIT D DEFICIENCY, FRACTURES): Vit D, 25-Hydroxy: 14.7 ng/mL — ABNORMAL LOW (ref 30.0–100.0)

## 2024-08-22 LAB — HEMOGLOBIN A1C
Est. average glucose Bld gHb Est-mCnc: 114 mg/dL
Hgb A1c MFr Bld: 5.6 % (ref 4.8–5.6)

## 2024-08-22 LAB — LIPID PANEL
Chol/HDL Ratio: 5.7 ratio — ABNORMAL HIGH (ref 0.0–4.4)
Cholesterol, Total: 235 mg/dL — ABNORMAL HIGH (ref 100–199)
HDL: 41 mg/dL (ref >39)
LDL Chol Calc (NIH): 151 mg/dL — ABNORMAL HIGH (ref 0–99)
Triglycerides: 235 mg/dL — ABNORMAL HIGH (ref 0–149)
VLDL Cholesterol Cal: 43 mg/dL — ABNORMAL HIGH (ref 5–40)

## 2024-08-23 DIAGNOSIS — M9903 Segmental and somatic dysfunction of lumbar region: Secondary | ICD-10-CM | POA: Diagnosis not present

## 2024-08-23 DIAGNOSIS — M9902 Segmental and somatic dysfunction of thoracic region: Secondary | ICD-10-CM | POA: Diagnosis not present

## 2024-08-23 DIAGNOSIS — M546 Pain in thoracic spine: Secondary | ICD-10-CM | POA: Diagnosis not present

## 2024-08-23 DIAGNOSIS — M62830 Muscle spasm of back: Secondary | ICD-10-CM | POA: Diagnosis not present

## 2024-08-23 DIAGNOSIS — M5412 Radiculopathy, cervical region: Secondary | ICD-10-CM | POA: Diagnosis not present

## 2024-08-23 DIAGNOSIS — M9911 Subluxation complex (vertebral) of cervical region: Secondary | ICD-10-CM | POA: Diagnosis not present

## 2024-08-23 DIAGNOSIS — M25572 Pain in left ankle and joints of left foot: Secondary | ICD-10-CM | POA: Diagnosis not present

## 2024-08-23 DIAGNOSIS — R519 Headache, unspecified: Secondary | ICD-10-CM | POA: Diagnosis not present

## 2024-08-23 DIAGNOSIS — M9901 Segmental and somatic dysfunction of cervical region: Secondary | ICD-10-CM | POA: Diagnosis not present

## 2024-08-31 ENCOUNTER — Ambulatory Visit: Payer: Self-pay | Admitting: Family Medicine

## 2024-08-31 DIAGNOSIS — E559 Vitamin D deficiency, unspecified: Secondary | ICD-10-CM

## 2024-08-31 MED ORDER — VITAMIN D (ERGOCALCIFEROL) 1.25 MG (50000 UNIT) PO CAPS: 50000.0000 [IU] | ORAL_CAPSULE | ORAL | 1 refills | Status: AC

## 2024-09-03 DIAGNOSIS — M5412 Radiculopathy, cervical region: Secondary | ICD-10-CM | POA: Diagnosis not present

## 2024-09-03 DIAGNOSIS — M9901 Segmental and somatic dysfunction of cervical region: Secondary | ICD-10-CM | POA: Diagnosis not present

## 2024-09-03 DIAGNOSIS — M9903 Segmental and somatic dysfunction of lumbar region: Secondary | ICD-10-CM | POA: Diagnosis not present

## 2024-09-03 DIAGNOSIS — M546 Pain in thoracic spine: Secondary | ICD-10-CM | POA: Diagnosis not present

## 2024-09-03 DIAGNOSIS — M9911 Subluxation complex (vertebral) of cervical region: Secondary | ICD-10-CM | POA: Diagnosis not present

## 2024-09-03 DIAGNOSIS — M9902 Segmental and somatic dysfunction of thoracic region: Secondary | ICD-10-CM | POA: Diagnosis not present

## 2024-09-03 DIAGNOSIS — M62830 Muscle spasm of back: Secondary | ICD-10-CM | POA: Diagnosis not present

## 2024-09-03 DIAGNOSIS — R519 Headache, unspecified: Secondary | ICD-10-CM | POA: Diagnosis not present

## 2024-09-03 DIAGNOSIS — M25572 Pain in left ankle and joints of left foot: Secondary | ICD-10-CM | POA: Diagnosis not present

## 2024-09-05 DIAGNOSIS — M9901 Segmental and somatic dysfunction of cervical region: Secondary | ICD-10-CM | POA: Diagnosis not present

## 2024-09-05 DIAGNOSIS — M9902 Segmental and somatic dysfunction of thoracic region: Secondary | ICD-10-CM | POA: Diagnosis not present

## 2024-09-05 DIAGNOSIS — R519 Headache, unspecified: Secondary | ICD-10-CM | POA: Diagnosis not present

## 2024-09-05 DIAGNOSIS — M62830 Muscle spasm of back: Secondary | ICD-10-CM | POA: Diagnosis not present

## 2024-09-05 DIAGNOSIS — M5412 Radiculopathy, cervical region: Secondary | ICD-10-CM | POA: Diagnosis not present

## 2024-09-05 DIAGNOSIS — M25572 Pain in left ankle and joints of left foot: Secondary | ICD-10-CM | POA: Diagnosis not present

## 2024-09-05 DIAGNOSIS — M9911 Subluxation complex (vertebral) of cervical region: Secondary | ICD-10-CM | POA: Diagnosis not present

## 2024-09-05 DIAGNOSIS — M9903 Segmental and somatic dysfunction of lumbar region: Secondary | ICD-10-CM | POA: Diagnosis not present

## 2024-09-05 DIAGNOSIS — M546 Pain in thoracic spine: Secondary | ICD-10-CM | POA: Diagnosis not present

## 2024-09-10 ENCOUNTER — Ambulatory Visit: Admitting: Family Medicine

## 2024-09-12 DIAGNOSIS — M62830 Muscle spasm of back: Secondary | ICD-10-CM | POA: Diagnosis not present

## 2024-09-12 DIAGNOSIS — R519 Headache, unspecified: Secondary | ICD-10-CM | POA: Diagnosis not present

## 2024-09-12 DIAGNOSIS — M546 Pain in thoracic spine: Secondary | ICD-10-CM | POA: Diagnosis not present

## 2024-09-12 DIAGNOSIS — M9902 Segmental and somatic dysfunction of thoracic region: Secondary | ICD-10-CM | POA: Diagnosis not present

## 2024-09-12 DIAGNOSIS — M5412 Radiculopathy, cervical region: Secondary | ICD-10-CM | POA: Diagnosis not present

## 2024-09-12 DIAGNOSIS — M9901 Segmental and somatic dysfunction of cervical region: Secondary | ICD-10-CM | POA: Diagnosis not present

## 2024-09-12 DIAGNOSIS — M25572 Pain in left ankle and joints of left foot: Secondary | ICD-10-CM | POA: Diagnosis not present

## 2024-09-12 DIAGNOSIS — M9911 Subluxation complex (vertebral) of cervical region: Secondary | ICD-10-CM | POA: Diagnosis not present

## 2024-09-12 DIAGNOSIS — M9903 Segmental and somatic dysfunction of lumbar region: Secondary | ICD-10-CM | POA: Diagnosis not present

## 2024-09-18 ENCOUNTER — Encounter: Payer: Self-pay | Admitting: Family Medicine

## 2024-09-18 ENCOUNTER — Ambulatory Visit (INDEPENDENT_AMBULATORY_CARE_PROVIDER_SITE_OTHER): Admitting: Family Medicine

## 2024-09-18 VITALS — BP 118/71 | HR 74 | Temp 98.5°F | Ht <= 58 in | Wt 160.7 lb

## 2024-09-18 DIAGNOSIS — E538 Deficiency of other specified B group vitamins: Secondary | ICD-10-CM

## 2024-09-18 DIAGNOSIS — Z7689 Persons encountering health services in other specified circumstances: Secondary | ICD-10-CM | POA: Diagnosis not present

## 2024-09-18 DIAGNOSIS — E66811 Obesity, class 1: Secondary | ICD-10-CM | POA: Diagnosis not present

## 2024-09-18 DIAGNOSIS — E782 Mixed hyperlipidemia: Secondary | ICD-10-CM | POA: Diagnosis not present

## 2024-09-18 DIAGNOSIS — E559 Vitamin D deficiency, unspecified: Secondary | ICD-10-CM | POA: Diagnosis not present

## 2024-09-18 MED ORDER — ROSUVASTATIN CALCIUM 5 MG PO TABS: 5.0000 mg | ORAL_TABLET | Freq: Every day | ORAL | 3 refills | Status: DC

## 2024-09-18 MED ORDER — BUPROPION HCL ER (SR) 150 MG PO TB12: ORAL_TABLET | ORAL | 3 refills | Status: DC

## 2024-09-18 MED ORDER — NALTREXONE HCL 50 MG PO TABS: 25.0000 mg | ORAL_TABLET | Freq: Every day | ORAL | 2 refills | Status: DC

## 2024-09-18 NOTE — Patient Instructions (Signed)
 Vitamin B12 daily

## 2024-09-18 NOTE — Progress Notes (Signed)
 Established patient visit   Patient: Lisa Solis   DOB: 02-11-2001   23 y.o. Female  MRN: [id number] Visit Date: 09/18/2024  Today's healthcare provider: LAURAINE LOISE BUOY, DO   Chief Complaint  Patient presents with   Weight Loss    Patient is here today to discuss other options of weight loss, stated that she was on Wegovy  and Semaglutide  before they both of them made her sick.  Couldn't eat and stayed in the bed.     Subjective    HPI Lisa Solis is a 23 year old female who presents for weight management and medication review.  She has been experiencing difficulty in losing weight despite various efforts, including a keto diet, treadmill exercises, standing at work, and other home exercises. Her weight remains between 158 to 162 pounds. She eats once a day and avoids high-calorie drinks, opting for water or no-sugar lemonade packets. No consumption of sodas, fast food, fried food, or desserts, which she previously enjoyed.  She previously tried Wegovy  (semaglutide ) for weight loss, but she experienced significant nausea and vomiting with it. She has not tried other medications for weight loss except for a brief period of Wellbutrin  (bupropion ) focused on mood, not weight loss.   She has been taking vitamin D  and has started on multivitamins with an iron supplement. She is not taking B12 supplements and had a borderline low level of 290 pg/mL noted last year. She reports low energy levels.      Medications: Outpatient Medications Prior to Visit  Medication Sig   Vitamin D , Ergocalciferol , (DRISDOL ) 1.25 MG (50000 UNIT) CAPS capsule Take 1 capsule (50,000 Units total) by mouth every 7 (seven) days.   [DISCONTINUED] rosuvastatin  (CRESTOR ) 10 MG tablet Take 1 tablet (10 mg total) by mouth daily. (Patient not taking: Reported on 09/18/2024)   No facility-administered medications prior to visit.         Objective    BP 118/71 (BP Location: Left Arm, Patient Position:  Sitting, Cuff Size: Normal)   Pulse 74   Temp 98.5 F (36.9 C) (Oral)   Ht 4' 10 (1.473 m)   Wt 160 lb 11.2 oz (72.9 kg)   LMP 08/05/2024 (Exact Date) Comment: Patient reports she had a period for 3 weeks.  SpO2 100%   BMI 33.59 kg/m     Physical Exam Vitals and nursing note reviewed.  Constitutional:      General: She is not in acute distress.    Appearance: Normal appearance.  HENT:     Head: Normocephalic and atraumatic.  Eyes:     General: No scleral icterus.    Conjunctiva/sclera: Conjunctivae normal.  Cardiovascular:     Rate and Rhythm: Normal rate.  Pulmonary:     Effort: Pulmonary effort is normal.  Neurological:     Mental Status: She is alert and oriented to person, place, and time. Mental status is at baseline.  Psychiatric:        Mood and Affect: Mood normal.        Behavior: Behavior normal.      No results found for any visits on 09/18/24.  Assessment & Plan    Obesity (BMI 30.0-34.9) -     buPROPion  HCl ER (SR); 150 mg orally once daily for 3 days, then increase to 300 mg/day  Dispense: 60 tablet; Refill: 3 -     Naltrexone HCl; Take 0.5-1 tablets (25-50 mg total) by mouth daily.  Dispense: 30 tablet; Refill:  2  Encounter for weight management -     buPROPion  HCl ER (SR); 150 mg orally once daily for 3 days, then increase to 300 mg/day  Dispense: 60 tablet; Refill: 3 -     Naltrexone HCl; Take 0.5-1 tablets (25-50 mg total) by mouth daily.  Dispense: 30 tablet; Refill: 2  Mixed hyperlipidemia -     Rosuvastatin  Calcium ; Take 1 tablet (5 mg total) by mouth daily.  Dispense: 90 tablet; Refill: 3  Vitamin D  deficiency  B12 deficiency      Obesity (BMI 30.0-34.9); encounter for weight management Weight loss efforts with keto diet, intermittent fasting,and exercise have been ineffective. Semaglutide  was previously used but caused significant nausea. - Prescribed bupropion  and naltrexone (generic for Contrave). - Advised starting naltrexone with  a half tablet, increase to whole tablet if needed. - Continue exercise and dietary modifications.  Mixed hyperlipidemia Has not initiated rosuvastatin  therapy. Will start on a lower dose due to lack of prior use. - Resent prescription for rosuvastatin  5 mg to [facility].  Vitamin D  deficiency Prescription obtained from [facility].  Recheck at follow up.  Borderline vitamin B12 deficiency B12 level at 290 pg/mL, near lower limit of normal, with reported low energy. - Recommended over-the-counter vitamin B12 supplement, 500 mcg daily.    Return in about 3 months (around 12/19/2024) for Weight.      I discussed the assessment and treatment plan with the patient  The patient was provided an opportunity to ask questions and all were answered. The patient agreed with the plan and demonstrated an understanding of the instructions.   The patient was advised to call back or seek an in-person evaluation if the symptoms worsen or if the condition fails to improve as anticipated.    LAURAINE LOISE BUOY, DO  [facility] [phone] (phone) [phone] (fax)  [facility]

## 2024-09-26 DIAGNOSIS — M9902 Segmental and somatic dysfunction of thoracic region: Secondary | ICD-10-CM | POA: Diagnosis not present

## 2024-09-26 DIAGNOSIS — M9901 Segmental and somatic dysfunction of cervical region: Secondary | ICD-10-CM | POA: Diagnosis not present

## 2024-09-26 DIAGNOSIS — R519 Headache, unspecified: Secondary | ICD-10-CM | POA: Diagnosis not present

## 2024-09-26 DIAGNOSIS — M62830 Muscle spasm of back: Secondary | ICD-10-CM | POA: Diagnosis not present

## 2024-09-26 DIAGNOSIS — M546 Pain in thoracic spine: Secondary | ICD-10-CM | POA: Diagnosis not present

## 2024-09-26 DIAGNOSIS — M5412 Radiculopathy, cervical region: Secondary | ICD-10-CM | POA: Diagnosis not present

## 2024-09-26 DIAGNOSIS — M9903 Segmental and somatic dysfunction of lumbar region: Secondary | ICD-10-CM | POA: Diagnosis not present

## 2024-09-26 DIAGNOSIS — M25572 Pain in left ankle and joints of left foot: Secondary | ICD-10-CM | POA: Diagnosis not present

## 2024-09-26 DIAGNOSIS — M9911 Subluxation complex (vertebral) of cervical region: Secondary | ICD-10-CM | POA: Diagnosis not present

## 2024-10-10 DIAGNOSIS — M5412 Radiculopathy, cervical region: Secondary | ICD-10-CM | POA: Diagnosis not present

## 2024-10-10 DIAGNOSIS — M9901 Segmental and somatic dysfunction of cervical region: Secondary | ICD-10-CM | POA: Diagnosis not present

## 2024-10-10 DIAGNOSIS — M25572 Pain in left ankle and joints of left foot: Secondary | ICD-10-CM | POA: Diagnosis not present

## 2024-10-10 DIAGNOSIS — M9911 Subluxation complex (vertebral) of cervical region: Secondary | ICD-10-CM | POA: Diagnosis not present

## 2024-10-10 DIAGNOSIS — M9902 Segmental and somatic dysfunction of thoracic region: Secondary | ICD-10-CM | POA: Diagnosis not present

## 2024-10-10 DIAGNOSIS — M9903 Segmental and somatic dysfunction of lumbar region: Secondary | ICD-10-CM | POA: Diagnosis not present

## 2024-10-10 DIAGNOSIS — M546 Pain in thoracic spine: Secondary | ICD-10-CM | POA: Diagnosis not present

## 2024-10-10 DIAGNOSIS — M62830 Muscle spasm of back: Secondary | ICD-10-CM | POA: Diagnosis not present

## 2024-10-10 DIAGNOSIS — R519 Headache, unspecified: Secondary | ICD-10-CM | POA: Diagnosis not present

## 2024-10-25 DIAGNOSIS — R519 Headache, unspecified: Secondary | ICD-10-CM | POA: Diagnosis not present

## 2024-10-25 DIAGNOSIS — M9901 Segmental and somatic dysfunction of cervical region: Secondary | ICD-10-CM | POA: Diagnosis not present

## 2024-10-25 DIAGNOSIS — M546 Pain in thoracic spine: Secondary | ICD-10-CM | POA: Diagnosis not present

## 2024-10-25 DIAGNOSIS — M9903 Segmental and somatic dysfunction of lumbar region: Secondary | ICD-10-CM | POA: Diagnosis not present

## 2024-10-25 DIAGNOSIS — M62830 Muscle spasm of back: Secondary | ICD-10-CM | POA: Diagnosis not present

## 2024-10-25 DIAGNOSIS — M9911 Subluxation complex (vertebral) of cervical region: Secondary | ICD-10-CM | POA: Diagnosis not present

## 2024-10-25 DIAGNOSIS — M25572 Pain in left ankle and joints of left foot: Secondary | ICD-10-CM | POA: Diagnosis not present

## 2024-10-25 DIAGNOSIS — M9902 Segmental and somatic dysfunction of thoracic region: Secondary | ICD-10-CM | POA: Diagnosis not present

## 2024-10-25 DIAGNOSIS — M5412 Radiculopathy, cervical region: Secondary | ICD-10-CM | POA: Diagnosis not present

## 2024-11-08 DIAGNOSIS — M9903 Segmental and somatic dysfunction of lumbar region: Secondary | ICD-10-CM | POA: Diagnosis not present

## 2024-11-08 DIAGNOSIS — M5412 Radiculopathy, cervical region: Secondary | ICD-10-CM | POA: Diagnosis not present

## 2024-11-08 DIAGNOSIS — M546 Pain in thoracic spine: Secondary | ICD-10-CM | POA: Diagnosis not present

## 2024-11-08 DIAGNOSIS — M62830 Muscle spasm of back: Secondary | ICD-10-CM | POA: Diagnosis not present

## 2024-11-08 DIAGNOSIS — M25572 Pain in left ankle and joints of left foot: Secondary | ICD-10-CM | POA: Diagnosis not present

## 2024-11-08 DIAGNOSIS — M9901 Segmental and somatic dysfunction of cervical region: Secondary | ICD-10-CM | POA: Diagnosis not present

## 2024-11-08 DIAGNOSIS — M9902 Segmental and somatic dysfunction of thoracic region: Secondary | ICD-10-CM | POA: Diagnosis not present

## 2024-11-08 DIAGNOSIS — R519 Headache, unspecified: Secondary | ICD-10-CM | POA: Diagnosis not present

## 2024-11-08 DIAGNOSIS — M9911 Subluxation complex (vertebral) of cervical region: Secondary | ICD-10-CM | POA: Diagnosis not present

## 2024-11-14 ENCOUNTER — Encounter: Payer: Self-pay | Admitting: Family Medicine

## 2024-11-14 ENCOUNTER — Other Ambulatory Visit: Payer: Self-pay

## 2024-11-14 DIAGNOSIS — Z7689 Persons encountering health services in other specified circumstances: Secondary | ICD-10-CM

## 2024-11-14 DIAGNOSIS — E66811 Obesity, class 1: Secondary | ICD-10-CM

## 2024-11-14 DIAGNOSIS — E782 Mixed hyperlipidemia: Secondary | ICD-10-CM

## 2024-11-14 MED ORDER — ROSUVASTATIN CALCIUM 5 MG PO TABS: 5.0000 mg | ORAL_TABLET | Freq: Every day | ORAL | 3 refills | Status: AC

## 2024-11-14 MED ORDER — NALTREXONE HCL 50 MG PO TABS: 25.0000 mg | ORAL_TABLET | Freq: Every day | ORAL | 2 refills | Status: DC

## 2024-11-22 DIAGNOSIS — R519 Headache, unspecified: Secondary | ICD-10-CM | POA: Diagnosis not present

## 2024-11-22 DIAGNOSIS — M546 Pain in thoracic spine: Secondary | ICD-10-CM | POA: Diagnosis not present

## 2024-11-22 DIAGNOSIS — M9901 Segmental and somatic dysfunction of cervical region: Secondary | ICD-10-CM | POA: Diagnosis not present

## 2024-11-22 DIAGNOSIS — M5412 Radiculopathy, cervical region: Secondary | ICD-10-CM | POA: Diagnosis not present

## 2024-11-22 DIAGNOSIS — M25572 Pain in left ankle and joints of left foot: Secondary | ICD-10-CM | POA: Diagnosis not present

## 2024-11-22 DIAGNOSIS — M9911 Subluxation complex (vertebral) of cervical region: Secondary | ICD-10-CM | POA: Diagnosis not present

## 2024-11-22 DIAGNOSIS — M9902 Segmental and somatic dysfunction of thoracic region: Secondary | ICD-10-CM | POA: Diagnosis not present

## 2024-11-22 DIAGNOSIS — M62830 Muscle spasm of back: Secondary | ICD-10-CM | POA: Diagnosis not present

## 2024-11-22 DIAGNOSIS — M9903 Segmental and somatic dysfunction of lumbar region: Secondary | ICD-10-CM | POA: Diagnosis not present

## 2024-12-01 ENCOUNTER — Other Ambulatory Visit: Payer: Self-pay

## 2024-12-01 ENCOUNTER — Emergency Department

## 2024-12-01 ENCOUNTER — Encounter: Payer: Self-pay | Admitting: Emergency Medicine

## 2024-12-01 DIAGNOSIS — R1110 Vomiting, unspecified: Secondary | ICD-10-CM | POA: Diagnosis not present

## 2024-12-01 DIAGNOSIS — R112 Nausea with vomiting, unspecified: Secondary | ICD-10-CM | POA: Diagnosis not present

## 2024-12-01 DIAGNOSIS — R0789 Other chest pain: Secondary | ICD-10-CM | POA: Diagnosis not present

## 2024-12-01 DIAGNOSIS — R1010 Upper abdominal pain, unspecified: Secondary | ICD-10-CM | POA: Diagnosis not present

## 2024-12-01 DIAGNOSIS — R079 Chest pain, unspecified: Secondary | ICD-10-CM | POA: Diagnosis not present

## 2024-12-01 LAB — COMPREHENSIVE METABOLIC PANEL WITH GFR
ALT: 23 U/L (ref 0–44)
AST: 25 U/L (ref 15–41)
Albumin: 4.6 g/dL (ref 3.5–5.0)
Alkaline Phosphatase: 85 U/L (ref 38–126)
Anion gap: 16 — ABNORMAL HIGH (ref 5–15)
BUN: 20 mg/dL (ref 6–20)
CO2: 20 mmol/L — ABNORMAL LOW (ref 22–32)
Calcium: 9.3 mg/dL (ref 8.9–10.3)
Chloride: 102 mmol/L (ref 98–111)
Creatinine, Ser: 0.72 mg/dL (ref 0.44–1.00)
GFR, Estimated: >60 mL/min
Glucose, Bld: 118 mg/dL — ABNORMAL HIGH (ref 70–99)
Potassium: 3.9 mmol/L (ref 3.5–5.1)
Sodium: 138 mmol/L (ref 135–145)
Total Bilirubin: 0.6 mg/dL (ref 0.0–1.2)
Total Protein: 7.4 g/dL (ref 6.5–8.1)

## 2024-12-01 LAB — CBC
HCT: 40.8 % (ref 36.0–46.0)
Hemoglobin: 13.4 g/dL (ref 12.0–15.0)
MCH: 26.2 pg (ref 26.0–34.0)
MCHC: 32.8 g/dL (ref 30.0–36.0)
MCV: 79.7 fL — ABNORMAL LOW (ref 80.0–100.0)
Platelets: 345 K/uL (ref 150–400)
RBC: 5.12 MIL/uL — ABNORMAL HIGH (ref 3.87–5.11)
RDW: 13.2 % (ref 11.5–15.5)
WBC: 10.5 K/uL (ref 4.0–10.5)
nRBC: 0 % (ref 0.0–0.2)

## 2024-12-01 LAB — RESP PANEL BY RT-PCR (RSV, FLU A&B, COVID)  RVPGX2
Influenza A by PCR: NEGATIVE
Influenza B by PCR: NEGATIVE
Resp Syncytial Virus by PCR: NEGATIVE
SARS Coronavirus 2 by RT PCR: NEGATIVE

## 2024-12-01 LAB — TROPONIN T, HIGH SENSITIVITY: Troponin T High Sensitivity: <15 ng/L (ref 0–19)

## 2024-12-01 LAB — LIPASE, BLOOD: Lipase: 19 U/L (ref 11–51)

## 2024-12-01 MED ORDER — ONDANSETRON 4 MG PO TBDP
4.0000 mg | ORAL_TABLET | Freq: Once | ORAL | Status: AC | PRN
  Administered 2024-12-01: 4 mg via ORAL
  Filled 2024-12-01: qty 1

## 2024-12-01 NOTE — ED Triage Notes (Signed)
 Pt arrived via POV with reports for vomiting x 24 hours, reports unable to keep water down, also c/o chest pain and shortness of breath, unable to get comfortable.  Has other sick contacts.

## 2024-12-02 ENCOUNTER — Emergency Department
Admission: EM | Admit: 2024-12-02 | Discharge: 2024-12-02 | Disposition: A | Discharge status: Home / Self Care | Attending: Emergency Medicine | Admitting: Emergency Medicine

## 2024-12-02 DIAGNOSIS — R112 Nausea with vomiting, unspecified: Secondary | ICD-10-CM

## 2024-12-02 LAB — URINALYSIS, ROUTINE W REFLEX MICROSCOPIC
Bilirubin Urine: NEGATIVE
Glucose, UA: NEGATIVE mg/dL
Hgb urine dipstick: NEGATIVE
Ketones, ur: NEGATIVE mg/dL
Leukocytes,Ua: NEGATIVE
Nitrite: NEGATIVE
Protein, ur: 100 mg/dL — AB
RBC / HPF: 0 RBC/hpf (ref 0–5)
Specific Gravity, Urine: 1.029 (ref 1.005–1.030)
pH: 5 (ref 5.0–8.0)

## 2024-12-02 LAB — POC URINE PREG, ED
Preg Test, Ur: NEGATIVE
Preg Test, Ur: NEGATIVE

## 2024-12-02 LAB — TROPONIN T, HIGH SENSITIVITY: Troponin T High Sensitivity: <15 ng/L (ref 0–19)

## 2024-12-02 MED ORDER — ONDANSETRON 4 MG PO TBDP: 4.0000 mg | ORAL_TABLET | Freq: Three times a day (TID) | ORAL | 0 refills | Status: DC | PRN

## 2024-12-02 MED ORDER — SODIUM CHLORIDE 0.9 % IV BOLUS
1000.0000 mL | Freq: Once | INTRAVENOUS | Status: AC
  Administered 2024-12-02: 1000 mL via INTRAVENOUS

## 2024-12-02 MED ORDER — ONDANSETRON HCL 4 MG/2ML IJ SOLN
4.0000 mg | Freq: Once | INTRAMUSCULAR | Status: AC
  Administered 2024-12-02: 4 mg via INTRAVENOUS
  Filled 2024-12-02: qty 2

## 2024-12-02 NOTE — ED Provider Notes (Signed)
 "  [facility] Provider Note    Event Date/Time   First MD Initiated Contact with Patient 12/02/24 [phone]     (approximate)   History   Emesis and Chest Pain   HPI  Lisa Solis is a 23 y.o. female who presents to the emergency department today for roughly 24-hour history of nausea and vomiting.  This has been accompanied by some left lower abdominal pain.  Patient states she has had similar upper abdominal pain in the past.  She has had a couple episodes of diarrhea.  She has not noticed any blood in the diarrhea or vomit.  She denies any fevers.  No known sick contacts.  No unusual ingestions.     Physical Exam   Triage Vital Signs: ED Triage Vitals  Encounter Vitals Group     BP 12/01/24 2122 120/70     Girls Systolic BP Percentile --      Girls Diastolic BP Percentile --      Boys Systolic BP Percentile --      Boys Diastolic BP Percentile --      Pulse Rate 12/01/24 2122 (!) 112     Resp 12/01/24 2122 18     Temp 12/01/24 2122 100.3 F (37.9 C)     Temp Source 12/01/24 2122 Oral     SpO2 12/01/24 2122 99 %     Weight 12/01/24 2118 154 lb (69.9 kg)     Height 12/01/24 2118 4' 9 (1.448 m)     Head Circumference --      Peak Flow --      Pain Score 12/01/24 2117 8     Pain Loc --      Pain Education --      Exclude from Growth Chart --     Most recent vital signs: Vitals:   12/02/24 0040 12/02/24 0331  BP: 112/75 (!) 102/53  Pulse: (!) 107 86  Resp: 18 16  Temp: 98.7 F (37.1 C) 98.4 F (36.9 C)  SpO2: 96% 99%   General: Awake, alert, oriented. CV:  Good peripheral perfusion. Regular rate and rhythm. Resp:  Normal effort. Lungs clear. Abd:  No distention. Minimally tender to palpation in the upper abdomen.  ED Results / Procedures / Treatments   Labs (all labs ordered are listed, but only abnormal results are displayed) Labs Reviewed  CBC - Abnormal; Notable for the following components:      Result Value   RBC 5.12 (*)     MCV 79.7 (*)    All other components within normal limits  COMPREHENSIVE METABOLIC PANEL WITH GFR - Abnormal; Notable for the following components:   CO2 20 (*)    Glucose, Bld 118 (*)    Anion gap 16 (*)    All other components within normal limits  RESP PANEL BY RT-PCR (RSV, FLU A&B, COVID)  RVPGX2  LIPASE, BLOOD  URINALYSIS, ROUTINE W REFLEX MICROSCOPIC  HCG, QUANTITATIVE, PREGNANCY  POC URINE PREG, ED  TROPONIN T, HIGH SENSITIVITY  TROPONIN T, HIGH SENSITIVITY     EKG  I, Guadalupe Eagles, attending physician, personally viewed and interpreted this EKG  EKG Time: 2125 Rate: 120 Rhythm: sinus tachycardia Axis: normal Intervals: qtc 432 QRS: narrow ST changes: no st elevation Impression: abnormal ekg    RADIOLOGY I independently interpreted and visualized the CXR. My interpretation: No pneumonia Radiology interpretation:  IMPRESSION:  No active cardiopulmonary disease.      PROCEDURES:  Critical Care performed: No  MEDICATIONS ORDERED IN ED: Medications  ondansetron  (ZOFRAN -ODT) disintegrating tablet 4 mg (4 mg Oral Given 12/01/24 2120)     IMPRESSION / MDM / ASSESSMENT AND PLAN / ED COURSE  I reviewed the triage vital signs and the nursing notes.                              Differential diagnosis includes, but is not limited to, gastroenteritis, pancreatitis, hepatitis, GERD  Patient's presentation is most consistent with acute presentation with potential threat to life or bodily function.   Patient presents to the emergency department today with primary concerns for nausea and vomiting for the past 24 hours.  Also endorses some upper abdominal pain although states that has been there for about a month.  On exam patient's abdomen is benign, no tenderness, soft.  Patient was initially tachycardic upon arrival to the emergency department.  Blood work without significant leukocytosis.  At this time I think gastroenteritis is likely.  Discussed this  with patient.  She did feel better after IV fluids and medication.  Will plan on discharging with prescription for nausea medication.  Encouraged patient to return for any new or worsening symptoms.   FINAL CLINICAL IMPRESSION(S) / ED DIAGNOSES   Final diagnoses:  Nausea and vomiting, unspecified vomiting type      Note:  This document was prepared using Dragon voice recognition software and may include unintentional dictation errors.    Floy Roberts, MD 12/02/24 [phone]  "

## 2024-12-04 ENCOUNTER — Encounter: Payer: Self-pay | Admitting: Family Medicine

## 2024-12-04 DIAGNOSIS — L509 Urticaria, unspecified: Secondary | ICD-10-CM

## 2024-12-18 NOTE — Telephone Encounter (Signed)
 Called and informed patient of the referral that was placed for an allergy evaluation.  Patient verbalized understanding.

## 2024-12-19 ENCOUNTER — Encounter: Payer: Self-pay | Admitting: Family Medicine

## 2024-12-19 ENCOUNTER — Other Ambulatory Visit: Payer: Self-pay | Admitting: Family Medicine

## 2024-12-19 ENCOUNTER — Ambulatory Visit (INDEPENDENT_AMBULATORY_CARE_PROVIDER_SITE_OTHER): Admitting: Family Medicine

## 2024-12-19 VITALS — BP 124/76 | HR 65 | Temp 98.1°F | Ht <= 58 in | Wt 159.3 lb

## 2024-12-19 DIAGNOSIS — Z79899 Other long term (current) drug therapy: Secondary | ICD-10-CM | POA: Diagnosis not present

## 2024-12-19 DIAGNOSIS — Z7689 Persons encountering health services in other specified circumstances: Secondary | ICD-10-CM | POA: Diagnosis not present

## 2024-12-19 DIAGNOSIS — E66811 Obesity, class 1: Secondary | ICD-10-CM

## 2024-12-19 DIAGNOSIS — E6609 Other obesity due to excess calories: Secondary | ICD-10-CM

## 2024-12-19 DIAGNOSIS — F320 Major depressive disorder, single episode, mild: Secondary | ICD-10-CM | POA: Diagnosis not present

## 2024-12-19 MED ORDER — PHENTERMINE HCL 15 MG PO CAPS: 15.0000 mg | ORAL_CAPSULE | ORAL | 0 refills | Status: AC

## 2024-12-19 MED ORDER — TOPIRAMATE 25 MG PO TABS: ORAL_TABLET | ORAL | 1 refills | Status: AC

## 2024-12-19 MED ORDER — PHENTERMINE HCL 37.5 MG PO CAPS: 37.5000 mg | ORAL_CAPSULE | ORAL | 0 refills | Status: AC

## 2024-12-19 NOTE — Patient Instructions (Addendum)
 Start phentermine  with ramp-up period -15 mg daily for 14 days followed by 37.5 mg daily until follow-up appointment. - Start topiramate  with tapering dose as noted. - Continue caloric restriction of 1600 cal/day and aim for 150 minutes on average per week of moderate intensity physical activity (ideally 30 to 60 minutes most days of the week).

## 2024-12-19 NOTE — Progress Notes (Addendum)
 "     Established patient visit   Patient: Lisa Solis   DOB: 02-22-2001   24 y.o. Female  MRN: [id number] Visit Date: 12/19/2024  Today's healthcare provider: LAURAINE LOISE BUOY, DO   Chief Complaint  Patient presents with   Follow-up    Patient is here for a follow up on weight management, states that she is tolerating medications well.  Expresses no concerns.   Subjective    HPI Lisa Solis is a 24 year old female who presents with medication intolerance and weight management concerns.  She experiences significant side effects from naltrexone , including feeling 'real sick', head fuzziness, dizziness, and hearing issues after the second or third day of use. She has not picked up a refill of naltrexone  since December and has not been taking it. She continues to take bupropion  (Wellbutrin ), vitamin D , and rosuvastatin  (Crestor ).  In December, she visited the ER for nausea and vomiting, which she believes was unrelated to her medication. She has not been consuming alcohol, which could exacerbate nausea and vomiting when taking naltrexone .  Her physical activity has decreased, possibly due to cold weather, and she feels like she is 'starving' herself without seeing weight loss results. She notes a tendency for 'bored eating', especially while watching movies or using her phone.  Her mood is 'about the same', with occasional irritability and aggravation, but she feels it is manageable. She is not interested in counseling or additional medication for mood management.  She has been taking rosuvastatin  for about a month and has some vitamin D  left. She had coffee with French vanilla creamer and caramel this morning.      Medications: Show/hide medication list[1]       Objective    BP 124/76 (BP Location: Right Arm, Patient Position: Sitting, Cuff Size: Normal)   Pulse 65   Temp 98.1 F (36.7 C) (Oral)   Ht 4' 9 (1.448 m)   Wt 159 lb 4.8 oz (72.3 kg)   LMP 10/31/2024 (Exact  Date)   SpO2 100%   BMI 34.47 kg/m     Physical Exam Vitals and nursing note reviewed.  Constitutional:      General: She is not in acute distress.    Appearance: Normal appearance.  HENT:     Head: Normocephalic and atraumatic.  Eyes:     General: No scleral icterus.    Conjunctiva/sclera: Conjunctivae normal.  Cardiovascular:     Rate and Rhythm: Normal rate.  Pulmonary:     Effort: Pulmonary effort is normal.  Neurological:     Mental Status: She is alert and oriented to person, place, and time. Mental status is at baseline.  Psychiatric:        Mood and Affect: Mood normal.        Behavior: Behavior normal.      No results found for any visits on 12/19/24.  Assessment & Plan    Obesity (BMI 30.0-34.9) -     EKG 12-Lead -     Phentermine  HCl; Take 1 capsule (37.5 mg total) by mouth every morning. Start AFTER completing phentermine  15 mg.  Dispense: 30 capsule; Refill: 0 -     Phentermine  HCl; Take 1 capsule (15 mg total) by mouth every morning.  Dispense: 14 capsule; Refill: 0 -     Topiramate ; Week 1: Take 1 tablet daily.  Week 2: Take 2 tablets daily.  Week 3: Take 3 tablets daily.  Week 4: Take 4 tablets daily.  If unable  to tolerate higher dose, stay at maximum tolerated dose.  Dispense: 120 tablet; Refill: 1  High risk medication use -     EKG 12-Lead -     Phentermine  HCl; Take 1 capsule (37.5 mg total) by mouth every morning. Start AFTER completing phentermine  15 mg.  Dispense: 30 capsule; Refill: 0 -     Phentermine  HCl; Take 1 capsule (15 mg total) by mouth every morning.  Dispense: 14 capsule; Refill: 0  Encounter for weight management -     Phentermine  HCl; Take 1 capsule (37.5 mg total) by mouth every morning. Start AFTER completing phentermine  15 mg.  Dispense: 30 capsule; Refill: 0 -     Phentermine  HCl; Take 1 capsule (15 mg total) by mouth every morning.  Dispense: 14 capsule; Refill: 0 -     Topiramate ; Week 1: Take 1 tablet daily.  Week 2: Take 2  tablets daily.  Week 3: Take 3 tablets daily.  Week 4: Take 4 tablets daily.  If unable to tolerate higher dose, stay at maximum tolerated dose.  Dispense: 120 tablet; Refill: 1  Depression, major, single episode, mild    Obesity (BMI 30.0-34.9); encounter for weight management Weight stable despite dietary efforts. Previous Wegovy  trials failed due to insurance coverage changes. Current medications include Wellbutrin  alone, as naltrexone  was not tolerated; ineffective for appetite suppression. Considering phentermine  and topiramate  as alternatives. Discussed potential combination of phentermine  and topiramate . - Ordered EKG to assess cardiac status before phentermine  - EKG showed sinus rhythm without ectopy with positive axes, with a rate of 67, PR interval 146, QRS 72 and QT/QTc 384/405. - Starting phentermine  with ramp-up period -15 mg daily for 14 days followed by 37.5 mg daily until follow-up appointment. - Starting topiramate  with tapering dose as noted. - Continue caloric restriction at 1600 cal/day and aim for 150 minutes on average per week of moderate intensity physical activity (ideally 30 to 60 minutes most days of the week).  Mixed hyperlipidemia Managed with rosuvastatin  5 mg daily. Dietary intake includes creamer affecting lipid levels. Fasting lipid panel needed for accurate assessment.  Plan to obtain lipid panel at next visit.  Depression, major, single episode, mild Mood well-managed without intervention with occasional irritability. No interest in counseling or additional medication. - Continue current management without additional medication or counseling.    Return in about 6 weeks (around 01/28/2025) for Weight.      I discussed the assessment and treatment plan with the patient  The patient was provided an opportunity to ask questions and all were answered. The patient agreed with the plan and demonstrated an understanding of the instructions.   The patient was  advised to call back or seek an in-person evaluation if the symptoms worsen or if the condition fails to improve as anticipated.    LAURAINE LOISE BUOY, DO  [facility] [phone] (phone) [phone] (fax)  [facility]     [1]  Outpatient Medications Prior to Visit  Medication Sig Note   rosuvastatin  (CRESTOR ) 5 MG tablet Take 1 tablet (5 mg total) by mouth daily.    Vitamin D , Ergocalciferol , (DRISDOL ) 1.25 MG (50000 UNIT) CAPS capsule Take 1 capsule (50,000 Units total) by mouth every 7 (seven) days.    [DISCONTINUED] buPROPion  (WELLBUTRIN  SR) 150 MG 12 hr tablet 150 mg orally once daily for 3 days, then increase to 300 mg/day    [DISCONTINUED] ondansetron  (ZOFRAN -ODT) 4 MG disintegrating tablet Take 1 tablet (4 mg total) by mouth  every 8 (eight) hours as needed for nausea or vomiting.    [DISCONTINUED] naltrexone  (DEPADE) 50 MG tablet Take 0.5-1 tablets (25-50 mg total) by mouth daily. 12/19/2024: Was driving had to pull over and get someone to come pick her up.  Was nauseous, dizzy, light-headed, and sweating.   No facility-administered medications prior to visit.   "

## 2025-01-30 ENCOUNTER — Ambulatory Visit: Admitting: Family Medicine
